# Patient Record
Sex: Male | Born: 1945
Health system: Southern US, Community
[De-identification: ages and names within clinical notes are randomized; demographics above are authoritative.]

## PROBLEM LIST (undated history)

## (undated) DIAGNOSIS — E785 Hyperlipidemia, unspecified: Secondary | ICD-10-CM

## (undated) DIAGNOSIS — E669 Obesity, unspecified: Secondary | ICD-10-CM

## (undated) DIAGNOSIS — G500 Trigeminal neuralgia: Secondary | ICD-10-CM

## (undated) DIAGNOSIS — I10 Essential (primary) hypertension: Secondary | ICD-10-CM

## (undated) DIAGNOSIS — M109 Gout, unspecified: Secondary | ICD-10-CM

## (undated) DIAGNOSIS — R42 Dizziness and giddiness: Secondary | ICD-10-CM

## (undated) DIAGNOSIS — Z800 Family history of malignant neoplasm of digestive organs: Secondary | ICD-10-CM

## (undated) DIAGNOSIS — E119 Type 2 diabetes mellitus without complications: Secondary | ICD-10-CM

## (undated) DIAGNOSIS — E039 Hypothyroidism, unspecified: Secondary | ICD-10-CM

## (undated) DIAGNOSIS — Z889 Allergy status to unspecified drugs, medicaments and biological substances status: Secondary | ICD-10-CM

## (undated) HISTORY — PX: COLONOSCOPY W/ POLYPECTOMY: SHX1380

## (undated) HISTORY — PX: INSERT / REPLACE / REMOVE PACEMAKER: SUR710

---

## 2005-07-18 ENCOUNTER — Ambulatory Visit: Payer: Self-pay | Admitting: Unknown Physician Specialty

## 2008-10-13 ENCOUNTER — Ambulatory Visit: Payer: Self-pay | Admitting: Unknown Physician Specialty

## 2010-08-27 ENCOUNTER — Emergency Department: Payer: Self-pay | Admitting: Emergency Medicine

## 2010-10-04 ENCOUNTER — Emergency Department: Payer: Self-pay | Admitting: Emergency Medicine

## 2013-10-24 ENCOUNTER — Ambulatory Visit: Payer: Self-pay | Admitting: Unknown Physician Specialty

## 2013-10-25 LAB — PATHOLOGY REPORT

## 2014-04-15 ENCOUNTER — Ambulatory Visit: Payer: Self-pay

## 2015-04-30 DIAGNOSIS — E039 Hypothyroidism, unspecified: Secondary | ICD-10-CM | POA: Diagnosis not present

## 2015-06-08 DIAGNOSIS — J208 Acute bronchitis due to other specified organisms: Secondary | ICD-10-CM | POA: Diagnosis not present

## 2015-06-08 DIAGNOSIS — B9689 Other specified bacterial agents as the cause of diseases classified elsewhere: Secondary | ICD-10-CM | POA: Diagnosis not present

## 2015-06-08 DIAGNOSIS — J019 Acute sinusitis, unspecified: Secondary | ICD-10-CM | POA: Diagnosis not present

## 2015-07-06 DIAGNOSIS — B9689 Other specified bacterial agents as the cause of diseases classified elsewhere: Secondary | ICD-10-CM | POA: Diagnosis not present

## 2015-07-06 DIAGNOSIS — J019 Acute sinusitis, unspecified: Secondary | ICD-10-CM | POA: Diagnosis not present

## 2015-07-06 DIAGNOSIS — R05 Cough: Secondary | ICD-10-CM | POA: Diagnosis not present

## 2015-07-24 DIAGNOSIS — J301 Allergic rhinitis due to pollen: Secondary | ICD-10-CM | POA: Diagnosis not present

## 2015-07-24 DIAGNOSIS — E038 Other specified hypothyroidism: Secondary | ICD-10-CM | POA: Diagnosis not present

## 2015-07-24 DIAGNOSIS — I159 Secondary hypertension, unspecified: Secondary | ICD-10-CM | POA: Diagnosis not present

## 2015-07-24 DIAGNOSIS — E063 Autoimmune thyroiditis: Secondary | ICD-10-CM | POA: Diagnosis not present

## 2015-07-24 DIAGNOSIS — G500 Trigeminal neuralgia: Secondary | ICD-10-CM | POA: Diagnosis not present

## 2015-07-24 DIAGNOSIS — M109 Gout, unspecified: Secondary | ICD-10-CM | POA: Diagnosis not present

## 2015-07-24 DIAGNOSIS — F5104 Psychophysiologic insomnia: Secondary | ICD-10-CM | POA: Diagnosis not present

## 2015-10-27 DIAGNOSIS — E039 Hypothyroidism, unspecified: Secondary | ICD-10-CM | POA: Diagnosis not present

## 2015-10-27 DIAGNOSIS — N400 Enlarged prostate without lower urinary tract symptoms: Secondary | ICD-10-CM | POA: Diagnosis not present

## 2015-10-27 DIAGNOSIS — I10 Essential (primary) hypertension: Secondary | ICD-10-CM | POA: Diagnosis not present

## 2015-10-27 DIAGNOSIS — E790 Hyperuricemia without signs of inflammatory arthritis and tophaceous disease: Secondary | ICD-10-CM | POA: Diagnosis not present

## 2015-10-27 DIAGNOSIS — Z0000 Encounter for general adult medical examination without abnormal findings: Secondary | ICD-10-CM | POA: Diagnosis not present

## 2015-10-27 DIAGNOSIS — E7800 Pure hypercholesterolemia, unspecified: Secondary | ICD-10-CM | POA: Diagnosis not present

## 2015-10-27 DIAGNOSIS — H8110 Benign paroxysmal vertigo, unspecified ear: Secondary | ICD-10-CM | POA: Diagnosis not present

## 2015-10-27 DIAGNOSIS — R739 Hyperglycemia, unspecified: Secondary | ICD-10-CM | POA: Diagnosis not present

## 2015-10-27 DIAGNOSIS — M722 Plantar fascial fibromatosis: Secondary | ICD-10-CM | POA: Diagnosis not present

## 2015-10-27 DIAGNOSIS — G500 Trigeminal neuralgia: Secondary | ICD-10-CM | POA: Diagnosis not present

## 2015-10-27 DIAGNOSIS — F5101 Primary insomnia: Secondary | ICD-10-CM | POA: Diagnosis not present

## 2015-10-27 DIAGNOSIS — F5104 Psychophysiologic insomnia: Secondary | ICD-10-CM | POA: Diagnosis not present

## 2015-10-27 DIAGNOSIS — Z6838 Body mass index (BMI) 38.0-38.9, adult: Secondary | ICD-10-CM | POA: Diagnosis not present

## 2015-10-27 DIAGNOSIS — Z125 Encounter for screening for malignant neoplasm of prostate: Secondary | ICD-10-CM | POA: Diagnosis not present

## 2015-12-09 DIAGNOSIS — Z08 Encounter for follow-up examination after completed treatment for malignant neoplasm: Secondary | ICD-10-CM | POA: Diagnosis not present

## 2015-12-09 DIAGNOSIS — Z1283 Encounter for screening for malignant neoplasm of skin: Secondary | ICD-10-CM | POA: Diagnosis not present

## 2015-12-09 DIAGNOSIS — D2372 Other benign neoplasm of skin of left lower limb, including hip: Secondary | ICD-10-CM | POA: Diagnosis not present

## 2015-12-09 DIAGNOSIS — Z85828 Personal history of other malignant neoplasm of skin: Secondary | ICD-10-CM | POA: Diagnosis not present

## 2015-12-09 DIAGNOSIS — L821 Other seborrheic keratosis: Secondary | ICD-10-CM | POA: Diagnosis not present

## 2015-12-09 DIAGNOSIS — L02821 Furuncle of head [any part, except face]: Secondary | ICD-10-CM | POA: Diagnosis not present

## 2016-01-28 DIAGNOSIS — Z6841 Body Mass Index (BMI) 40.0 and over, adult: Secondary | ICD-10-CM | POA: Diagnosis not present

## 2016-01-28 DIAGNOSIS — I10 Essential (primary) hypertension: Secondary | ICD-10-CM | POA: Diagnosis not present

## 2016-01-28 DIAGNOSIS — E039 Hypothyroidism, unspecified: Secondary | ICD-10-CM | POA: Diagnosis not present

## 2016-01-28 DIAGNOSIS — F5101 Primary insomnia: Secondary | ICD-10-CM | POA: Diagnosis not present

## 2016-01-28 DIAGNOSIS — E7800 Pure hypercholesterolemia, unspecified: Secondary | ICD-10-CM | POA: Diagnosis not present

## 2016-01-28 DIAGNOSIS — R0609 Other forms of dyspnea: Secondary | ICD-10-CM | POA: Diagnosis not present

## 2016-01-28 DIAGNOSIS — R4189 Other symptoms and signs involving cognitive functions and awareness: Secondary | ICD-10-CM | POA: Diagnosis not present

## 2016-01-28 DIAGNOSIS — G500 Trigeminal neuralgia: Secondary | ICD-10-CM | POA: Diagnosis not present

## 2016-01-28 DIAGNOSIS — R739 Hyperglycemia, unspecified: Secondary | ICD-10-CM | POA: Diagnosis not present

## 2016-01-28 DIAGNOSIS — N400 Enlarged prostate without lower urinary tract symptoms: Secondary | ICD-10-CM | POA: Diagnosis not present

## 2016-01-28 DIAGNOSIS — H8112 Benign paroxysmal vertigo, left ear: Secondary | ICD-10-CM | POA: Diagnosis not present

## 2016-01-28 DIAGNOSIS — E790 Hyperuricemia without signs of inflammatory arthritis and tophaceous disease: Secondary | ICD-10-CM | POA: Diagnosis not present

## 2016-02-09 DIAGNOSIS — J208 Acute bronchitis due to other specified organisms: Secondary | ICD-10-CM | POA: Diagnosis not present

## 2016-02-19 DIAGNOSIS — E038 Other specified hypothyroidism: Secondary | ICD-10-CM | POA: Diagnosis not present

## 2016-02-19 DIAGNOSIS — G500 Trigeminal neuralgia: Secondary | ICD-10-CM | POA: Diagnosis not present

## 2016-02-19 DIAGNOSIS — M109 Gout, unspecified: Secondary | ICD-10-CM | POA: Diagnosis not present

## 2016-02-19 DIAGNOSIS — E063 Autoimmune thyroiditis: Secondary | ICD-10-CM | POA: Diagnosis not present

## 2016-02-19 DIAGNOSIS — J301 Allergic rhinitis due to pollen: Secondary | ICD-10-CM | POA: Diagnosis not present

## 2016-02-19 DIAGNOSIS — F5104 Psychophysiologic insomnia: Secondary | ICD-10-CM | POA: Diagnosis not present

## 2016-03-29 DIAGNOSIS — B9689 Other specified bacterial agents as the cause of diseases classified elsewhere: Secondary | ICD-10-CM | POA: Diagnosis not present

## 2016-03-29 DIAGNOSIS — J019 Acute sinusitis, unspecified: Secondary | ICD-10-CM | POA: Diagnosis not present

## 2016-05-03 DIAGNOSIS — N400 Enlarged prostate without lower urinary tract symptoms: Secondary | ICD-10-CM | POA: Diagnosis not present

## 2016-05-03 DIAGNOSIS — R739 Hyperglycemia, unspecified: Secondary | ICD-10-CM | POA: Diagnosis not present

## 2016-05-03 DIAGNOSIS — I10 Essential (primary) hypertension: Secondary | ICD-10-CM | POA: Diagnosis not present

## 2016-05-03 DIAGNOSIS — F5101 Primary insomnia: Secondary | ICD-10-CM | POA: Diagnosis not present

## 2016-05-03 DIAGNOSIS — E039 Hypothyroidism, unspecified: Secondary | ICD-10-CM | POA: Diagnosis not present

## 2016-05-03 DIAGNOSIS — Z6841 Body Mass Index (BMI) 40.0 and over, adult: Secondary | ICD-10-CM | POA: Diagnosis not present

## 2016-05-03 DIAGNOSIS — G500 Trigeminal neuralgia: Secondary | ICD-10-CM | POA: Diagnosis not present

## 2016-08-09 DIAGNOSIS — E039 Hypothyroidism, unspecified: Secondary | ICD-10-CM | POA: Diagnosis not present

## 2016-08-09 DIAGNOSIS — I10 Essential (primary) hypertension: Secondary | ICD-10-CM | POA: Diagnosis not present

## 2016-08-09 DIAGNOSIS — E7800 Pure hypercholesterolemia, unspecified: Secondary | ICD-10-CM | POA: Diagnosis not present

## 2016-08-09 DIAGNOSIS — E790 Hyperuricemia without signs of inflammatory arthritis and tophaceous disease: Secondary | ICD-10-CM | POA: Diagnosis not present

## 2016-08-09 DIAGNOSIS — F5101 Primary insomnia: Secondary | ICD-10-CM | POA: Diagnosis not present

## 2016-08-09 DIAGNOSIS — R739 Hyperglycemia, unspecified: Secondary | ICD-10-CM | POA: Diagnosis not present

## 2016-08-09 DIAGNOSIS — N400 Enlarged prostate without lower urinary tract symptoms: Secondary | ICD-10-CM | POA: Diagnosis not present

## 2016-08-09 DIAGNOSIS — Z6841 Body Mass Index (BMI) 40.0 and over, adult: Secondary | ICD-10-CM | POA: Diagnosis not present

## 2016-08-26 DIAGNOSIS — G500 Trigeminal neuralgia: Secondary | ICD-10-CM | POA: Diagnosis not present

## 2016-10-31 DIAGNOSIS — J40 Bronchitis, not specified as acute or chronic: Secondary | ICD-10-CM | POA: Diagnosis not present

## 2016-10-31 DIAGNOSIS — R05 Cough: Secondary | ICD-10-CM | POA: Diagnosis not present

## 2016-11-04 ENCOUNTER — Inpatient Hospital Stay
Admission: EM | Admit: 2016-11-04 | Discharge: 2016-11-06 | DRG: 190 | Disposition: A | Discharge status: Home / Self Care | Payer: PPO | Attending: Internal Medicine | Admitting: Internal Medicine

## 2016-11-04 ENCOUNTER — Emergency Department: Payer: PPO

## 2016-11-04 DIAGNOSIS — Z7951 Long term (current) use of inhaled steroids: Secondary | ICD-10-CM

## 2016-11-04 DIAGNOSIS — E785 Hyperlipidemia, unspecified: Secondary | ICD-10-CM | POA: Diagnosis not present

## 2016-11-04 DIAGNOSIS — Z880 Allergy status to penicillin: Secondary | ICD-10-CM | POA: Diagnosis not present

## 2016-11-04 DIAGNOSIS — J189 Pneumonia, unspecified organism: Secondary | ICD-10-CM | POA: Diagnosis not present

## 2016-11-04 DIAGNOSIS — Z79899 Other long term (current) drug therapy: Secondary | ICD-10-CM | POA: Diagnosis not present

## 2016-11-04 DIAGNOSIS — N400 Enlarged prostate without lower urinary tract symptoms: Secondary | ICD-10-CM | POA: Diagnosis not present

## 2016-11-04 DIAGNOSIS — J441 Chronic obstructive pulmonary disease with (acute) exacerbation: Principal | ICD-10-CM | POA: Diagnosis present

## 2016-11-04 DIAGNOSIS — R05 Cough: Secondary | ICD-10-CM | POA: Diagnosis not present

## 2016-11-04 DIAGNOSIS — Z6841 Body Mass Index (BMI) 40.0 and over, adult: Secondary | ICD-10-CM | POA: Diagnosis not present

## 2016-11-04 DIAGNOSIS — E860 Dehydration: Secondary | ICD-10-CM | POA: Diagnosis present

## 2016-11-04 DIAGNOSIS — Z9109 Other allergy status, other than to drugs and biological substances: Secondary | ICD-10-CM | POA: Diagnosis not present

## 2016-11-04 DIAGNOSIS — F0390 Unspecified dementia without behavioral disturbance: Secondary | ICD-10-CM | POA: Diagnosis present

## 2016-11-04 DIAGNOSIS — M109 Gout, unspecified: Secondary | ICD-10-CM | POA: Diagnosis not present

## 2016-11-04 DIAGNOSIS — E039 Hypothyroidism, unspecified: Secondary | ICD-10-CM | POA: Diagnosis present

## 2016-11-04 DIAGNOSIS — J440 Chronic obstructive pulmonary disease with acute lower respiratory infection: Secondary | ICD-10-CM | POA: Diagnosis present

## 2016-11-04 DIAGNOSIS — Z888 Allergy status to other drugs, medicaments and biological substances status: Secondary | ICD-10-CM

## 2016-11-04 DIAGNOSIS — Z8249 Family history of ischemic heart disease and other diseases of the circulatory system: Secondary | ICD-10-CM

## 2016-11-04 DIAGNOSIS — R0602 Shortness of breath: Secondary | ICD-10-CM | POA: Diagnosis not present

## 2016-11-04 DIAGNOSIS — I10 Essential (primary) hypertension: Secondary | ICD-10-CM | POA: Diagnosis present

## 2016-11-04 DIAGNOSIS — G500 Trigeminal neuralgia: Secondary | ICD-10-CM | POA: Diagnosis not present

## 2016-11-04 DIAGNOSIS — Z833 Family history of diabetes mellitus: Secondary | ICD-10-CM | POA: Diagnosis not present

## 2016-11-04 DIAGNOSIS — E871 Hypo-osmolality and hyponatremia: Secondary | ICD-10-CM | POA: Diagnosis not present

## 2016-11-04 DIAGNOSIS — J301 Allergic rhinitis due to pollen: Secondary | ICD-10-CM | POA: Diagnosis present

## 2016-11-04 DIAGNOSIS — Z91013 Allergy to seafood: Secondary | ICD-10-CM | POA: Diagnosis not present

## 2016-11-04 LAB — CBC WITH DIFFERENTIAL/PLATELET
BASOS ABS: 0 10*3/uL (ref 0–0.1)
Basophils Relative: 0 %
EOS ABS: 0.3 10*3/uL (ref 0–0.7)
EOS PCT: 3 %
HCT: 44.4 % (ref 40.0–52.0)
HEMOGLOBIN: 15.6 g/dL (ref 13.0–18.0)
LYMPHS ABS: 1.3 10*3/uL (ref 1.0–3.6)
LYMPHS PCT: 17 %
MCH: 32.4 pg (ref 26.0–34.0)
MCHC: 35 g/dL (ref 32.0–36.0)
MCV: 92.4 fL (ref 80.0–100.0)
Monocytes Absolute: 1 10*3/uL (ref 0.2–1.0)
Monocytes Relative: 12 %
NEUTROS PCT: 68 %
Neutro Abs: 5.2 10*3/uL (ref 1.4–6.5)
PLATELETS: 114 10*3/uL — AB (ref 150–440)
RBC: 4.81 MIL/uL (ref 4.40–5.90)
RDW: 13.2 % (ref 11.5–14.5)
WBC: 7.8 10*3/uL (ref 3.8–10.6)

## 2016-11-04 LAB — BASIC METABOLIC PANEL
ANION GAP: 9 (ref 5–15)
BUN: 18 mg/dL (ref 6–20)
CALCIUM: 9.3 mg/dL (ref 8.9–10.3)
CO2: 22 mmol/L (ref 22–32)
Chloride: 98 mmol/L — ABNORMAL LOW (ref 101–111)
Creatinine, Ser: 0.86 mg/dL (ref 0.61–1.24)
Glucose, Bld: 130 mg/dL — ABNORMAL HIGH (ref 65–99)
POTASSIUM: 4 mmol/L (ref 3.5–5.1)
Sodium: 129 mmol/L — ABNORMAL LOW (ref 135–145)

## 2016-11-04 LAB — BRAIN NATRIURETIC PEPTIDE: B NATRIURETIC PEPTIDE 5: 12 pg/mL (ref 0.0–100.0)

## 2016-11-04 MED ORDER — DEXTROSE 5 % IV SOLN
1.0000 g | Freq: Once | INTRAVENOUS | Status: AC
  Administered 2016-11-05: 1 g via INTRAVENOUS
  Filled 2016-11-04: qty 10

## 2016-11-04 MED ORDER — SODIUM CHLORIDE 0.9 % IV BOLUS (SEPSIS)
500.0000 mL | Freq: Once | INTRAVENOUS | Status: AC
  Administered 2016-11-04: 500 mL via INTRAVENOUS

## 2016-11-04 MED ORDER — ALBUTEROL SULFATE (2.5 MG/3ML) 0.083% IN NEBU
5.0000 mg | INHALATION_SOLUTION | Freq: Once | RESPIRATORY_TRACT | Status: AC
  Administered 2016-11-04: 5 mg via RESPIRATORY_TRACT
  Filled 2016-11-04: qty 6

## 2016-11-04 MED ORDER — DEXTROSE 5 % IV SOLN
500.0000 mg | Freq: Once | INTRAVENOUS | Status: AC
  Administered 2016-11-05: 500 mg via INTRAVENOUS
  Filled 2016-11-04: qty 500

## 2016-11-04 MED ORDER — METHYLPREDNISOLONE SODIUM SUCC 125 MG IJ SOLR
125.0000 mg | Freq: Once | INTRAMUSCULAR | Status: AC
  Administered 2016-11-04: 125 mg via INTRAVENOUS
  Filled 2016-11-04: qty 2

## 2016-11-04 NOTE — ED Notes (Signed)
Ambulated pt. Down hallway about 100 ft. Pt. Maintained sats above 96 %.

## 2016-11-04 NOTE — ED Provider Notes (Addendum)
[facility] Emergency Department Provider Note  ____________________________________________   I have reviewed the triage vital signs and the nursing notes.   HISTORY  Chief Complaint Shortness of Breath and Cough    HPI Brian Huffman. is a 71 y.o. male with a history of COPD, multiple other medical problem is presents today with cough for 2 weeks, he has productive cough, subjective fever and chills at home, he has been started on a Z-Pak but is unable to breathe at home when he is here for that reason. Denies chest pain.  Nothing makes this better, nothing makes it worse. He does not have any significant orthopnea.   No past medical history on file.  There are no active problems to display for this patient.   No past surgical history on file.  Prior to Admission medications   Medication Sig Start Date End Date Taking? Authorizing Provider  allopurinol (ZYLOPRIM) 100 MG tablet Take 1 tablet by mouth daily. 09/18/16  Yes [provider]  amLODipine (NORVASC) 5 MG tablet Take 5 mg by mouth daily.   Yes [provider]  atorvastatin (LIPITOR) 10 MG tablet Take 10 mg by mouth daily.   Yes [provider]  azelastine (ASTELIN) 0.1 % nasal spray Place 2 sprays into both nostrils 2 (two) times daily. Use in each nostril as directed   Yes [provider]  azithromycin (ZITHROMAX) 250 MG tablet Take 1 tablet by mouth daily. 10/31/16  Yes [provider]  benzonatate (TESSALON) 100 MG capsule Take 100 mg by mouth 3 (three) times daily as needed for cough.   Yes [provider]  candesartan (ATACAND) 8 MG tablet Take 1 tablet by mouth 2 (two) times daily. 10/27/16  Yes [provider]  carbamazepine (CARBATROL) 100 MG 12 hr capsule Take 1 capsule by mouth 4 (four) times daily.  10/26/16  Yes [provider]  carbamazepine (TEGRETOL) 100 MG chewable tablet Chew 100 mg by mouth as needed.    Yes [provider]  cetirizine (ZYRTEC) 10 MG tablet Take 10 mg by mouth daily.   Yes [provider]  fluticasone (FLONASE) 50 MCG/ACT nasal spray Place 2 sprays into both nostrils daily.   Yes [provider]  gabapentin (NEURONTIN) 100 MG capsule Take 100 mg by mouth 3 (three) times daily.   Yes [provider]  levothyroxine (SYNTHROID, LEVOTHROID) 175 MCG tablet Take 175 mcg by mouth daily before breakfast.   Yes [provider]  memantine (NAMENDA) 5 MG tablet Take 5 mg by mouth 2 (two) times daily.   Yes [provider]  promethazine (PHENERGAN) 25 MG tablet Take 25 mg by mouth every 6 (six) hours as needed for nausea or vomiting.   Yes [provider]  rosuvastatin (CRESTOR) 5 MG tablet Take 5 mg by mouth as needed.    Yes [provider]  tamsulosin (FLOMAX) 0.4 MG CAPS capsule Take 0.4 mg by mouth daily.   Yes [provider]  traZODone (DESYREL) 50 MG tablet Take 50 mg by mouth at bedtime.   Yes [provider]    Allergies [place] grass allergy skin test; Other; Statins; Penicillins; and Shellfish-derived products  No family history on file.  Social History Social History  Substance Use Topics  . Smoking status: Not on file  . Smokeless tobacco: Not on file  . Alcohol use Not on file    Review of Systems Constitutional: positivefever/chills Eyes: No visual changes. ENT: No  sore throat. No stiff neck no neck pain Cardiovascular: Denies chest pain. Respiratory: positiveshortness of breath. Gastrointestinal:   no vomiting.  No diarrhea.  No constipation. Genitourinary: Negative for dysuria. Musculoskeletal: Negative lower extremity swelling Skin: Negative for rash. Neurological: Negative for severe headaches, focal weakness or numbness.   ____________________________________________   PHYSICAL EXAM:  VITAL SIGNS: ED Triage Vitals  Enc Vitals Group     BP 11/04/16 2039 (!)  184/76     Pulse Rate 11/04/16 2039 (!) 106     Resp 11/04/16 2039 (!) 32     Temp 11/04/16 2039 98.5 F (36.9 C)     Temp Source 11/04/16 2039 Oral     SpO2 11/04/16 2039 94 %     Weight 11/04/16 2040 240 lb (108.9 kg)     Height 11/04/16 2040 5' 4.5" (1.638 m)     Head Circumference --      Peak Flow --      Pain Score 11/04/16 2038 3     Pain Loc --      Pain Edu? --      Excl. in [place]? --     Constitutional: Alert and oriented. Initially, tachypnea, working to breathe. Eyes: Conjunctivae are normal Head: Atraumatic HEENT: No congestion/rhinnorhea. Mucous membranes are moist.  Oropharynx non-erythematous Neck:   Nontender with no meningismus, no masses, no stridor Cardiovascular: Normal rate, regular rhythm. Grossly normal heart sounds.  Good peripheral circulation. Respiratory: wrist respiratory effort with initial wheezes and occasional rhonchi noted. Abdominal: Soft and nontender. No distention. No guarding no rebound Back:  There is no focal tenderness or step off.  there is no midline tenderness there are no lesions noted. there is no CVA tenderness  Musculoskeletal: No lower extremity tenderness, no upper extremity tenderness. No joint effusions, no DVT signs strong distal pulses slight symmetric edema Neurologic:  Normal speech and language. No gross focal neurologic deficits are appreciated.  Skin:  Skin is warm, dry and intact. No rash noted. Psychiatric: Mood and affect are normal. Speech and behavior are normal.  ____________________________________________   LABS (all labs ordered are listed, but only abnormal results are displayed)  Labs Reviewed  CBC WITH DIFFERENTIAL/PLATELET - Abnormal; Notable for the following:       Result Value   Platelets 114 (*)    All other components within normal limits  BASIC METABOLIC PANEL - Abnormal; Notable for the following:    Sodium 129 (*)    Chloride 98 (*)    Glucose, Bld 130 (*)    All other components within normal  limits  CULTURE, BLOOD (ROUTINE X 2)  CULTURE, BLOOD (ROUTINE X 2)  BRAIN NATRIURETIC PEPTIDE   ____________________________________________  EKG  I personally interpreted any EKGs ordered by me or triage Sinus rhythm rate 94 bpm, left bundle-branch block. ____________________________________________  RADIOLOGY  I reviewed any imaging ordered by me or triage that were performed during my shift and, if possible, patient and/or family made aware of any abnormal findings. ____________________________________________   PROCEDURES  Procedure(s) performed: None  Procedures  Critical Care performed: None  ____________________________________________   INITIAL IMPRESSION / ASSESSMENT AND PLAN / ED COURSE  Pertinent labs & imaging results that were available during my care of the patient were reviewed by me and considered in my medical decision making (see chart for details).  Administration was COPD, fever and productive cough. Failed outpatient antibiotics. X-ray shows possible infiltrate. We are giving him prednisone and antibiotics and breathing treatments. His lungs were  much better after initial treatment, sats are reassuring respiratory rate is improving, patient is overall appearing better but as he has failed outpatient therapy I think he would benefit from admission hospitalist agrees and will admit   ----------------------------------------- 12:43 AM on 11/05/2016 -----------------------------------------  Troponin order did not process. I have ordered as an add on as a precaution. hospitalist and oncoming ED physician to follow up.     ____________________________________________   FINAL CLINICAL IMPRESSION(S) / ED DIAGNOSES  Final diagnoses:  COPD exacerbation ([place])      This chart was dictated using voice recognition software.  Despite best efforts to proofread,  errors can occur which can change meaning.      Schuyler Amor, MD 11/05/16 8833     Schuyler Amor, MD 11/05/16 [phone]

## 2016-11-04 NOTE — ED Triage Notes (Signed)
Pt to triage ambulatory with noted raspy sounding cough and shortness of breath. Pt reports seen by his PCP on Tuesday and started on a z pack and tessalon pearls. No improvement. Pt with tachypnea  during triage with diminished breath sounds through out.

## 2016-11-04 NOTE — ED Notes (Signed)
Pt. States having cough and not feeling well for the past 2 weeks.  Pt. States he saw his PCP this Tuesday and was started on Z-pack and tessalon pearls with no improvement.  Pt. States increase in shortness of breath today.

## 2016-11-05 DIAGNOSIS — F0390 Unspecified dementia without behavioral disturbance: Secondary | ICD-10-CM | POA: Diagnosis present

## 2016-11-05 DIAGNOSIS — Z888 Allergy status to other drugs, medicaments and biological substances status: Secondary | ICD-10-CM | POA: Diagnosis not present

## 2016-11-05 DIAGNOSIS — E039 Hypothyroidism, unspecified: Secondary | ICD-10-CM | POA: Diagnosis present

## 2016-11-05 DIAGNOSIS — J301 Allergic rhinitis due to pollen: Secondary | ICD-10-CM | POA: Diagnosis present

## 2016-11-05 DIAGNOSIS — G500 Trigeminal neuralgia: Secondary | ICD-10-CM | POA: Diagnosis present

## 2016-11-05 DIAGNOSIS — N400 Enlarged prostate without lower urinary tract symptoms: Secondary | ICD-10-CM | POA: Diagnosis present

## 2016-11-05 DIAGNOSIS — Z79899 Other long term (current) drug therapy: Secondary | ICD-10-CM | POA: Diagnosis not present

## 2016-11-05 DIAGNOSIS — J440 Chronic obstructive pulmonary disease with acute lower respiratory infection: Secondary | ICD-10-CM | POA: Diagnosis present

## 2016-11-05 DIAGNOSIS — Z9109 Other allergy status, other than to drugs and biological substances: Secondary | ICD-10-CM | POA: Diagnosis not present

## 2016-11-05 DIAGNOSIS — Z7951 Long term (current) use of inhaled steroids: Secondary | ICD-10-CM | POA: Diagnosis not present

## 2016-11-05 DIAGNOSIS — I10 Essential (primary) hypertension: Secondary | ICD-10-CM | POA: Diagnosis present

## 2016-11-05 DIAGNOSIS — Z6841 Body Mass Index (BMI) 40.0 and over, adult: Secondary | ICD-10-CM | POA: Diagnosis not present

## 2016-11-05 DIAGNOSIS — Z880 Allergy status to penicillin: Secondary | ICD-10-CM | POA: Diagnosis not present

## 2016-11-05 DIAGNOSIS — E871 Hypo-osmolality and hyponatremia: Secondary | ICD-10-CM | POA: Diagnosis present

## 2016-11-05 DIAGNOSIS — M109 Gout, unspecified: Secondary | ICD-10-CM | POA: Diagnosis present

## 2016-11-05 DIAGNOSIS — Z91013 Allergy to seafood: Secondary | ICD-10-CM | POA: Diagnosis not present

## 2016-11-05 DIAGNOSIS — E860 Dehydration: Secondary | ICD-10-CM | POA: Diagnosis present

## 2016-11-05 DIAGNOSIS — Z833 Family history of diabetes mellitus: Secondary | ICD-10-CM | POA: Diagnosis not present

## 2016-11-05 DIAGNOSIS — R0602 Shortness of breath: Secondary | ICD-10-CM | POA: Diagnosis present

## 2016-11-05 DIAGNOSIS — Z8249 Family history of ischemic heart disease and other diseases of the circulatory system: Secondary | ICD-10-CM | POA: Diagnosis not present

## 2016-11-05 DIAGNOSIS — E785 Hyperlipidemia, unspecified: Secondary | ICD-10-CM | POA: Diagnosis present

## 2016-11-05 DIAGNOSIS — J189 Pneumonia, unspecified organism: Secondary | ICD-10-CM | POA: Diagnosis present

## 2016-11-05 DIAGNOSIS — J441 Chronic obstructive pulmonary disease with (acute) exacerbation: Secondary | ICD-10-CM | POA: Diagnosis present

## 2016-11-05 LAB — CBC
HEMATOCRIT: 42.7 % (ref 40.0–52.0)
Hemoglobin: 15.3 g/dL (ref 13.0–18.0)
MCH: 33.1 pg (ref 26.0–34.0)
MCHC: 35.8 g/dL (ref 32.0–36.0)
MCV: 92.6 fL (ref 80.0–100.0)
Platelets: 140 10*3/uL — ABNORMAL LOW (ref 150–440)
RBC: 4.62 MIL/uL (ref 4.40–5.90)
RDW: 13.2 % (ref 11.5–14.5)
WBC: 9.8 10*3/uL (ref 3.8–10.6)

## 2016-11-05 LAB — BASIC METABOLIC PANEL
Anion gap: 11 (ref 5–15)
BUN: 14 mg/dL (ref 6–20)
CHLORIDE: 98 mmol/L — AB (ref 101–111)
CO2: 21 mmol/L — ABNORMAL LOW (ref 22–32)
CREATININE: 1.15 mg/dL (ref 0.61–1.24)
Calcium: 9 mg/dL (ref 8.9–10.3)
GFR calc Af Amer: >60 mL/min (ref >60)
GFR calc non Af Amer: >60 mL/min (ref >60)
GLUCOSE: 155 mg/dL — AB (ref 65–99)
POTASSIUM: 4.4 mmol/L (ref 3.5–5.1)
Sodium: 130 mmol/L — ABNORMAL LOW (ref 135–145)

## 2016-11-05 LAB — TROPONIN I: Troponin I: <0.03 ng/mL (ref <0.03)

## 2016-11-05 MED ORDER — ALLOPURINOL 100 MG PO TABS
100.0000 mg | ORAL_TABLET | Freq: Every day | ORAL | Status: DC
  Administered 2016-11-05 – 2016-11-06 (×2): 100 mg via ORAL
  Filled 2016-11-05 (×2): qty 1

## 2016-11-05 MED ORDER — PROMETHAZINE HCL 25 MG PO TABS
25.0000 mg | ORAL_TABLET | Freq: Four times a day (QID) | ORAL | Status: DC | PRN
  Filled 2016-11-05: qty 1

## 2016-11-05 MED ORDER — BISACODYL 5 MG PO TBEC: 5.0000 mg | DELAYED_RELEASE_TABLET | Freq: Every day | ORAL | Status: DC | PRN

## 2016-11-05 MED ORDER — ATORVASTATIN CALCIUM 20 MG PO TABS
10.0000 mg | ORAL_TABLET | Freq: Every day | ORAL | Status: DC
  Administered 2016-11-05: 10 mg via ORAL
  Filled 2016-11-05: qty 1

## 2016-11-05 MED ORDER — ACETAMINOPHEN 325 MG PO TABS
650.0000 mg | ORAL_TABLET | Freq: Four times a day (QID) | ORAL | Status: DC | PRN
  Administered 2016-11-05 (×2): 650 mg via ORAL
  Filled 2016-11-05 (×2): qty 2

## 2016-11-05 MED ORDER — ENOXAPARIN SODIUM 40 MG/0.4ML ~~LOC~~ SOLN
40.0000 mg | Freq: Two times a day (BID) | SUBCUTANEOUS | Status: DC
  Administered 2016-11-05 (×2): 40 mg via SUBCUTANEOUS
  Filled 2016-11-05 (×2): qty 0.4

## 2016-11-05 MED ORDER — IRBESARTAN 150 MG PO TABS
75.0000 mg | ORAL_TABLET | Freq: Every day | ORAL | Status: DC
  Administered 2016-11-05 – 2016-11-06 (×2): 75 mg via ORAL
  Filled 2016-11-05 (×2): qty 1

## 2016-11-05 MED ORDER — DEXTROSE 5 % IV SOLN
1.0000 g | INTRAVENOUS | Status: DC
  Administered 2016-11-06: 10:00:00 1 g via INTRAVENOUS
  Filled 2016-11-05: qty 10

## 2016-11-05 MED ORDER — CARBAMAZEPINE ER 100 MG PO TB12
100.0000 mg | ORAL_TABLET | Freq: Four times a day (QID) | ORAL | Status: DC
  Filled 2016-11-05 (×2): qty 1

## 2016-11-05 MED ORDER — IPRATROPIUM BROMIDE 0.02 % IN SOLN: 0.5000 mg | Freq: Four times a day (QID) | RESPIRATORY_TRACT | Status: DC | PRN

## 2016-11-05 MED ORDER — BENZONATATE 100 MG PO CAPS
100.0000 mg | ORAL_CAPSULE | Freq: Three times a day (TID) | ORAL | Status: DC | PRN
  Administered 2016-11-05: 15:00:00 100 mg via ORAL
  Filled 2016-11-05: qty 1

## 2016-11-05 MED ORDER — OXYCODONE HCL 5 MG PO TABS
5.0000 mg | ORAL_TABLET | ORAL | Status: DC | PRN
  Administered 2016-11-05: 03:00:00 5 mg via ORAL
  Filled 2016-11-05 (×2): qty 1

## 2016-11-05 MED ORDER — ACETAMINOPHEN 650 MG RE SUPP: 650.0000 mg | Freq: Four times a day (QID) | RECTAL | Status: DC | PRN

## 2016-11-05 MED ORDER — TRAZODONE HCL 50 MG PO TABS
50.0000 mg | ORAL_TABLET | Freq: Every day | ORAL | Status: DC
  Administered 2016-11-05: 50 mg via ORAL
  Filled 2016-11-05: qty 1

## 2016-11-05 MED ORDER — MAGNESIUM CITRATE PO SOLN
1.0000 | Freq: Once | ORAL | Status: DC | PRN
Start: 2016-11-05 — End: 2016-11-06
  Filled 2016-11-05: qty 296

## 2016-11-05 MED ORDER — SODIUM CHLORIDE 0.9 % IV SOLN
INTRAVENOUS | Status: DC
  Administered 2016-11-05 (×2): via INTRAVENOUS

## 2016-11-05 MED ORDER — ROSUVASTATIN CALCIUM 10 MG PO TABS: 5.0000 mg | ORAL_TABLET | Freq: Every day | ORAL | Status: DC

## 2016-11-05 MED ORDER — ONDANSETRON HCL 4 MG PO TABS: 4.0000 mg | ORAL_TABLET | Freq: Four times a day (QID) | ORAL | Status: DC | PRN

## 2016-11-05 MED ORDER — LEVOTHYROXINE SODIUM 50 MCG PO TABS
175.0000 ug | ORAL_TABLET | Freq: Every day | ORAL | Status: DC
  Administered 2016-11-05 – 2016-11-06 (×2): 175 ug via ORAL
  Filled 2016-11-05 (×3): qty 1

## 2016-11-05 MED ORDER — TAMSULOSIN HCL 0.4 MG PO CAPS
0.4000 mg | ORAL_CAPSULE | Freq: Every day | ORAL | Status: DC
  Administered 2016-11-05 – 2016-11-06 (×2): 0.4 mg via ORAL
  Filled 2016-11-05 (×2): qty 1

## 2016-11-05 MED ORDER — MEMANTINE HCL 5 MG PO TABS
5.0000 mg | ORAL_TABLET | Freq: Two times a day (BID) | ORAL | Status: DC
  Administered 2016-11-05 – 2016-11-06 (×3): 5 mg via ORAL
  Filled 2016-11-05 (×3): qty 1

## 2016-11-05 MED ORDER — DEXTROSE 5 % IV SOLN
500.0000 mg | INTRAVENOUS | Status: DC
  Administered 2016-11-06: 11:00:00 500 mg via INTRAVENOUS
  Filled 2016-11-05: qty 500

## 2016-11-05 MED ORDER — METHYLPREDNISOLONE SODIUM SUCC 125 MG IJ SOLR
60.0000 mg | Freq: Four times a day (QID) | INTRAMUSCULAR | Status: DC
  Administered 2016-11-05 – 2016-11-06 (×6): 60 mg via INTRAVENOUS
  Filled 2016-11-05 (×6): qty 2

## 2016-11-05 MED ORDER — ALBUTEROL SULFATE (2.5 MG/3ML) 0.083% IN NEBU: 2.5000 mg | INHALATION_SOLUTION | Freq: Four times a day (QID) | RESPIRATORY_TRACT | Status: DC | PRN

## 2016-11-05 MED ORDER — LORATADINE 10 MG PO TABS
10.0000 mg | ORAL_TABLET | Freq: Every day | ORAL | Status: DC
  Administered 2016-11-05 – 2016-11-06 (×2): 10 mg via ORAL
  Filled 2016-11-05 (×2): qty 1

## 2016-11-05 MED ORDER — CARBAMAZEPINE ER 100 MG PO TB12
100.0000 mg | ORAL_TABLET | Freq: Four times a day (QID) | ORAL | Status: DC
  Administered 2016-11-05 – 2016-11-06 (×6): 100 mg via ORAL
  Filled 2016-11-05 (×11): qty 1

## 2016-11-05 MED ORDER — GABAPENTIN 100 MG PO CAPS
100.0000 mg | ORAL_CAPSULE | Freq: Three times a day (TID) | ORAL | Status: DC
  Administered 2016-11-05 – 2016-11-06 (×4): 100 mg via ORAL
  Filled 2016-11-05 (×4): qty 1

## 2016-11-05 MED ORDER — FLUTICASONE PROPIONATE 50 MCG/ACT NA SUSP
2.0000 | Freq: Every day | NASAL | Status: DC
  Administered 2016-11-05 – 2016-11-06 (×2): 2 via NASAL
  Filled 2016-11-05: qty 16

## 2016-11-05 MED ORDER — AMLODIPINE BESYLATE 5 MG PO TABS
5.0000 mg | ORAL_TABLET | Freq: Every day | ORAL | Status: DC
  Administered 2016-11-05 – 2016-11-06 (×2): 5 mg via ORAL
  Filled 2016-11-05 (×2): qty 1

## 2016-11-05 MED ORDER — ONDANSETRON HCL 4 MG/2ML IJ SOLN: 4.0000 mg | Freq: Four times a day (QID) | INTRAMUSCULAR | Status: DC | PRN

## 2016-11-05 MED ORDER — SENNOSIDES-DOCUSATE SODIUM 8.6-50 MG PO TABS: 1.0000 | ORAL_TABLET | Freq: Every evening | ORAL | Status: DC | PRN

## 2016-11-05 NOTE — Progress Notes (Signed)
Adm,itted  for community-acquired pneumonia, failed outpatient therapy. Continue IV Rocephin, Zithromax, IV hydration, nebulizers. Patient likely will be going home tomorrow. Vitals stable, sodium improved. He says he feels better. No hypoxia, vitals stable. Continue IV fluids today. Time spent; 25 min

## 2016-11-05 NOTE — ED Notes (Signed)
Pt transport to 124 

## 2016-11-05 NOTE — Progress Notes (Signed)
Pharmacy Antibiotic Note  Brian Huffman. is a 71 y.o. male admitted on 11/04/2016 with pneumonia.  Pharmacy has been consulted for ceftriaxone dosing.  Plan: Will start ceftriaxone 1g IV daily  Height: 5\' 4"  (162.6 cm) Weight: 243 lb 3.2 oz (110.3 kg) IBW/kg (Calculated) : 59.2  Temp (24hrs), Avg:99 F (37.2 C), Min:98.5 F (36.9 C), Max:99.5 F (37.5 C)   Recent Labs Lab 11/04/16 2105  WBC 7.8  CREATININE 0.86    Estimated Creatinine Clearance: 87.4 mL/min (by C-G formula based on SCr of 0.86 mg/dL).    Allergies  Allergen Reactions  . [place] Grass Allergy Skin Test Other (See Comments)    Difficulty breathing  . Other Other (See Comments)    DUST, sneezing, watery eyes  . Statins Other (See Comments)    Atorvastatin, simvastatin and pravastatin    . Penicillins Rash  . Shellfish-Derived Products Rash     Thank you for allowing pharmacy to be a part of this patient's care.  Tobie Lords, PharmD, BCPS Clinical Pharmacist 11/05/2016

## 2016-11-05 NOTE — Progress Notes (Signed)
Physical Therapy Evaluation Patient Details Name: Brian Huffman. MRN: [id number] DOB: [birth year] Today's Date: 11/05/2016   History of Present Illness  Patient is a 71 y.o. male admitted on 22 JUL for increasing cough x2 weeks, not responding to antibiotic. PMH includes BPH, HTN, HLD, and hyperglycemia.  Clinical Impression  Patient is a pleasant male admitted for above listed reasons. Patient demonstrates independence in all aspects of mobility with oxygen saturations remaining >95% with 340' ambulation. Patient is deemed to be at baseline level of function and will not require PT upon d/c when medically ready.    Follow Up Recommendations No PT follow up    Equipment Recommendations  None recommended by PT    Recommendations for Other Services       Precautions / Restrictions Precautions Precautions: None Restrictions Weight Bearing Restrictions: No      Mobility  Bed Mobility Overal bed mobility: Independent             General bed mobility comments: Patient seated at EOB upon PT arrival.  Transfers Overall transfer level: Independent Equipment used: None             General transfer comment: Patient moves from sit to stand and stand to sit with good control.  Ambulation/Gait Ambulation/Gait assistance: Min guard Ambulation Distance (Feet): 340 Feet Assistive device: None       General Gait Details: Patient ambulates with non-antalgic gait, at normal cadence.  Stairs            Wheelchair Mobility    Modified Rankin (Stroke Patients Only)       Balance Overall balance assessment: Independent                                           Pertinent Vitals/Pain Pain Assessment: No/denies pain    Home Living Family/patient expects to be discharged to:: Private residence Living Arrangements: Spouse/significant other Available Help at Discharge: Family;Friend(s);Available PRN/intermittently Type of Home:  House Home Access: Stairs to enter Entrance Stairs-Rails: Can reach both Entrance Stairs-Number of Steps: 3 Home Layout: One level Home Equipment: Cane - single point      Prior Function Level of Independence: Independent               Hand Dominance        Extremity/Trunk Assessment   Upper Extremity Assessment Upper Extremity Assessment: Overall WFL for tasks assessed    Lower Extremity Assessment Lower Extremity Assessment: Overall WFL for tasks assessed       Communication   Communication: No difficulties  Cognition Arousal/Alertness: Awake/alert Behavior During Therapy: WFL for tasks assessed/performed Overall Cognitive Status: Within Functional Limits for tasks assessed                                        General Comments      Exercises     Assessment/Plan    PT Assessment Patent does not need any further PT services  PT Problem List         PT Treatment Interventions      PT Goals (Current goals can be found in the Care Plan section)  Acute Rehab PT Goals Patient Stated Goal: To feel better PT Goal Formulation: With patient Time For Goal Achievement: 11/19/16 Potential to Achieve Goals:  Good    Frequency     Barriers to discharge        Co-evaluation               AM-PAC PT "6 Clicks" Daily Activity  Outcome Measure Difficulty turning over in bed (including adjusting bedclothes, sheets and blankets)?: None Difficulty moving from lying on back to sitting on the side of the bed? : None Difficulty sitting down on and standing up from a chair with arms (e.g., wheelchair, bedside commode, etc,.)?: None Help needed moving to and from a bed to chair (including a wheelchair)?: None Help needed walking in hospital room?: None Help needed climbing 3-5 steps with a railing? : A Little 6 Click Score: 23    End of Session Equipment Utilized During Treatment: Gait belt Activity Tolerance: Patient tolerated treatment  well Patient left: in bed;with call bell/phone within reach;with family/visitor present   PT Visit Diagnosis: Muscle weakness (generalized) (M62.81)    Time: 4128-2081 PT Time Calculation (min) (ACUTE ONLY): 14 min   Charges:   PT Evaluation $PT Eval Low Complexity: 1 Procedure     PT G Codes:          Dorice Lamas, PT, DPT 11/05/2016, 2:43 PM

## 2016-11-05 NOTE — H&P (Addendum)
History and Physical   [facility] - [place] @ [facility] Admission History and Physical [employer], D.O.    Patient Name: Brian Huffman MR#: [id number] Date of Birth: 04/15/1946 Date of Admission: 11/04/2016  Referring MD/NP/PA: Dr. Burlene Arnt Primary Care Physician: System, Pcp Not In Patient coming from: Home  Chief Complaint:  Chief Complaint  Patient presents with  . Shortness of Breath  . Cough    HPI: Brian Huffman is a 71 y.o. male with a known history of BPH, hypertension, hyperlipidemia, hypothyroidism, hyperglycemia, morbid obesity presents to the emergency department for evaluation of shortness of breath and cough.  Patient was in a usual state of health until 2 weeks ago he developed a cough productive of clear to white sputum associated with fevers and chills. He has been taking a azithromycin however symptoms have been progressively worsening despite outpatient therapy.  Patient denies fevers/chills, weakness, dizziness, chest pain, N/V/C/D, abdominal pain, dysuria/frequency, changes in mental status.    Otherwise there has been no change in status. Patient has been taking medication as prescribed and there has been no recent change in medication or diet. There has been no recent illness, hospitalizations, travel or sick contacts.    EMS/ED Course: Patient received Rocephin, erythromycin, Solu-Medrol, nebulizer and normal saline. Medical admission was requested for further management of community-acquired pneumonia.  Review of Systems:  CONSTITUTIONAL: Positive fever/chills, fatigue, negative weakness, weight gain/loss, headache. EYES: No blurry or double vision. ENT: No tinnitus, postnasal drip, redness or soreness of the oropharynx. RESPIRATORY: Positive cough, dyspnea, wheeze.  No hemoptysis.  CARDIOVASCULAR: No chest pain, palpitations, syncope, orthopnea. No lower extremity edema.  GASTROINTESTINAL: No nausea, vomiting, abdominal pain, diarrhea, constipation.   No hematemesis, melena or hematochezia. GENITOURINARY: No dysuria, frequency, hematuria. ENDOCRINE: No polyuria or nocturia. No heat or cold intolerance. HEMATOLOGY: No anemia, bruising, bleeding. INTEGUMENTARY: No rashes, ulcers, lesions. MUSCULOSKELETAL: No arthritis, gout, dyspnea. NEUROLOGIC: No numbness, tingling, ataxia, seizure-type activity, weakness. PSYCHIATRIC: No anxiety, depression, insomnia.   Active Problems Reconcile with Patient's Chart  Problem Noted Date  Idiopathic trigeminal neuralgia 08/30/2016  Obesity, morbid, BMI 40.0-49.9 (CMS-HCC) 08/30/2016  Hyperglycemia, unspecified 01/28/2016  Hyperuricemia 10/27/2015  Benign prostatic hyperplasia without lower urinary tract symptoms 10/27/2015  Primary insomnia 10/27/2015  BPPV (benign paroxysmal positional vertigo) 10/27/2015  BMI 40.0-44.9, adult (CMS-HCC) 10/27/2015  Secondary trigeminal neuralgia 03/28/2015  Allergic rhinitis due to pollen 09/12/2014  Hypertension 02/11/2013  Hypothyroidism, unspecified 08/02/2012  Hyperlipidemia, unspecified 08/02/2012  Gout 08/02/2012  Trigeminal neuralgia      No past surgical history on file.   has no tobacco, alcohol, and drug history on file.  Allergies  Allergen Reactions  . [place] Grass Allergy Skin Test Other (See Comments)    Difficulty breathing  . Other Other (See Comments)    DUST, sneezing, watery eyes  . Statins Other (See Comments)    Atorvastatin, simvastatin and pravastatin    . Penicillins Rash  . Shellfish-Derived Products Rash    Family History   Medical History Relation Name Comments  Colon cancer Father    Diabetes type II Father    Stroke Mother    Colon cancer Sister    High blood pressure (Hypertension) Sister    Hyperlipidemia (Elevated cholesterol) Sister    Diabetes type II Son       Prior to Admission medications   Medication Sig Start Date End Date Taking? Authorizing Provider  allopurinol (ZYLOPRIM) 100 MG  tablet Take 1 tablet by mouth daily. 09/18/16  Yes [provider]  amLODipine (NORVASC) 5 MG tablet Take 5 mg by mouth daily.   Yes [provider]  atorvastatin (LIPITOR) 10 MG tablet Take 10 mg by mouth daily.   Yes [provider]  azelastine (ASTELIN) 0.1 % nasal spray Place 2 sprays into both nostrils 2 (two) times daily. Use in each nostril as directed   Yes [provider]  azithromycin (ZITHROMAX) 250 MG tablet Take 1 tablet by mouth daily. 10/31/16  Yes [provider]  benzonatate (TESSALON) 100 MG capsule Take 100 mg by mouth 3 (three) times daily as needed for cough.   Yes [provider]  candesartan (ATACAND) 8 MG tablet Take 1 tablet by mouth 2 (two) times daily. 10/27/16  Yes [provider]  carbamazepine (CARBATROL) 100 MG 12 hr capsule Take 1 capsule by mouth 4 (four) times daily.  10/26/16  Yes [provider]  carbamazepine (TEGRETOL) 100 MG chewable tablet Chew 100 mg by mouth as needed.   Yes [provider]  cetirizine (ZYRTEC) 10 MG tablet Take 10 mg by mouth daily.   Yes [provider]  fluticasone (FLONASE) 50 MCG/ACT nasal spray Place 2 sprays into both nostrils daily.   Yes [provider]  gabapentin (NEURONTIN) 100 MG capsule Take 100 mg by mouth 3 (three) times daily.   Yes [provider]  levothyroxine (SYNTHROID, LEVOTHROID) 175 MCG tablet Take 175 mcg by mouth daily before breakfast.   Yes [provider]  memantine (NAMENDA) 5 MG tablet Take 5 mg by mouth 2 (two) times daily.   Yes [provider]  promethazine (PHENERGAN) 25 MG tablet Take 25 mg by mouth every 6 (six) hours as needed for nausea or vomiting.   Yes [provider]  rosuvastatin (CRESTOR) 5 MG tablet Take 5 mg by mouth as needed.    Yes [provider]  tamsulosin (FLOMAX) 0.4 MG CAPS capsule Take 0.4 mg by mouth daily.   Yes [provider]    traZODone (DESYREL) 50 MG tablet Take 50 mg by mouth at bedtime.   Yes [provider]    Physical Exam: Vitals:   11/04/16 2238 11/04/16 2300 11/04/16 2345 11/05/16 0015  BP: 121/65     Pulse: 94 93 85 86  Resp:      Temp:      TempSrc:      SpO2: 98% 94% 96% 94%  Weight:      Height:        GENERAL: 71 y.o.-year-old male patient, well-developed, well-nourished lying in the bed in no acute distress.  Pleasant and cooperative.   HEENT: Head atraumatic, normocephalic. Pupils equal, round, reactive to light and accommodation. No scleral icterus. Extraocular muscles intact. Nares are patent. Oropharynx is clear. Mucus membranes moist. NECK: Supple, full range of motion. No JVD, no bruit heard. No thyroid enlargement, no tenderness, no cervical lymphadenopathy. CHEST: Bibasilar rhonchi.. No use of accessory muscles of respiration.  No reproducible chest wall tenderness.  CARDIOVASCULAR: S1, S2 normal. No murmurs, rubs, or gallops. Cap refill <2 seconds. Pulses intact distally.  ABDOMEN: Soft, nondistended, nontender. No rebound, guarding, rigidity. Normoactive bowel sounds present in all four quadrants. No organomegaly or mass. EXTREMITIES: No pedal edema, cyanosis, or clubbing. No calf tenderness or Homan's sign.  NEUROLOGIC: The patient is alert and oriented x 3. Cranial nerves II through XII are grossly intact with no focal sensorimotor deficit.  PSYCHIATRIC:  Normal affect, mood, thought content. SKIN: Warm, dry, and intact without  obvious rash, lesion, or ulcer.    Labs on Admission:  CBC:  Recent Labs Lab 11/04/16 2105  WBC 7.8  NEUTROABS 5.2  HGB 15.6  HCT 44.4  MCV 92.4  PLT 096*   Basic Metabolic Panel:  Recent Labs Lab 11/04/16 2105  NA 129*  K 4.0  CL 98*  CO2 22  GLUCOSE 130*  BUN 18  CREATININE 0.86  CALCIUM 9.3   GFR: Estimated Creatinine Clearance: 87.6 mL/min (by C-G formula based on SCr of 0.86 mg/dL). Liver Function Tests: No results  for input(s): AST, ALT, ALKPHOS, BILITOT, PROT, ALBUMIN in the last 168 hours. No results for input(s): LIPASE, AMYLASE in the last 168 hours. No results for input(s): AMMONIA in the last 168 hours. Coagulation Profile: No results for input(s): INR, PROTIME in the last 168 hours. Cardiac Enzymes: No results for input(s): CKTOTAL, CKMB, CKMBINDEX, TROPONINI in the last 168 hours. BNP (last 3 results) No results for input(s): PROBNP in the last 8760 hours. HbA1C: No results for input(s): HGBA1C in the last 72 hours. CBG: No results for input(s): GLUCAP in the last 168 hours. Lipid Profile: No results for input(s): CHOL, HDL, LDLCALC, TRIG, CHOLHDL, LDLDIRECT in the last 72 hours. Thyroid Function Tests: No results for input(s): TSH, T4TOTAL, FREET4, T3FREE, THYROIDAB in the last 72 hours. Anemia Panel: No results for input(s): VITAMINB12, FOLATE, FERRITIN, TIBC, IRON, RETICCTPCT in the last 72 hours. Urine analysis: No results found for: COLORURINE, APPEARANCEUR, LABSPEC, PHURINE, GLUCOSEU, HGBUR, BILIRUBINUR, KETONESUR, PROTEINUR, UROBILINOGEN, NITRITE, LEUKOCYTESUR Sepsis Labs: @LABRCNTIP (procalcitonin:4,lacticidven:4) )No results found for this or any previous visit (from the past 240 hour(s)).   Radiological Exams on Admission: Dg Chest 2 View  Result Date: 11/04/2016 CLINICAL DATA:  Cough and malaise for the past 2 weeks. EXAM: CHEST  2 VIEW COMPARISON:  04/15/2014 FINDINGS: The heart size and mediastinal contours are within normal limits. Patchy bibasilar opacities are noted suspicious for atelectasis and/or minimal pneumonia. No effusion or pneumothorax. No acute nor suspicious osseous abnormality. IMPRESSION: Subtle bibasilar airspace opacities suspicious for atelectasis and/or minimal pneumonia. Electronically Signed   By: Ashley Royalty M.D.   On: 11/04/2016 21:39    EKG: Normal sinus rhythm at 94 bpm with normal axis, left bundle branch block and nonspecific ST-T wave changes.     Assessment/Plan  This is a 71 y.o. male with a history of COPD, BPH, hypertension, hyperlipidemia, hypothyroidism, hyperglycemia, morbid obesity now being admitted with:  #. Community Acquired Pneumonia, failed outpatient therapy - Admit to inpatient - IV Rocephin & Azithromycin per pharmacy - IV fluid hydration - Duonebs, expectorants & O2 therapy as needed - Follow up blood & sputum cultures -Continue Zyrtec, Flonase  #. Hyponatremia - IVFs -Repeat BMP in AM  #. History of hypertension -Continue Norvasc, Arava Sartin  #. History of hyperlipidemia - Continue Lipitor  #. History of gout - Continue allopurinol  #. History of hypothyroidism - Continue Synthroid  #. History of dementia - Continue Namenda  Admission status: Inpatient IV Fluids: Normal saline Diet/Nutrition: Heart healthy Consults called: None  DVT Px: Lovenox, SCDs and early ambulation. Code Status: Full Code  Disposition Plan: To home in 1-2 days  All the records are reviewed and case discussed with ED provider. Management plans discussed with the patient and/or family who express understanding and agree with plan of care.  Vear Staton D.O. on 11/05/2016 at 12:41 AM Between 7am to 6pm - Pager - [phone] After 6pm go to [email or web address] - [place]  [facility] Office [phone] CC: Primary care physician; System, Pcp Not In   11/05/2016, 12:41 AM

## 2016-11-06 MED ORDER — AZITHROMYCIN 500 MG PO TABS: 500.0000 mg | ORAL_TABLET | Freq: Every day | ORAL | Status: DC

## 2016-11-06 MED ORDER — LEVOFLOXACIN 500 MG PO TABS: 500.0000 mg | ORAL_TABLET | Freq: Every day | ORAL | 0 refills | Status: AC

## 2016-11-06 MED ORDER — ALBUTEROL SULFATE HFA 108 (90 BASE) MCG/ACT IN AERS: 2.0000 | INHALATION_SPRAY | Freq: Four times a day (QID) | RESPIRATORY_TRACT | 2 refills | Status: AC | PRN

## 2016-11-06 MED ORDER — PREDNISONE 10 MG (21) PO TBPK: ORAL_TABLET | ORAL | 0 refills | Status: DC

## 2016-11-06 MED ORDER — HYDROCOD POLST-CPM POLST ER 10-8 MG/5ML PO SUER
5.0000 mL | Freq: Two times a day (BID) | ORAL | Status: DC | PRN
  Administered 2016-11-06: 5 mL via ORAL
  Filled 2016-11-06: qty 5

## 2016-11-06 NOTE — Discharge Summary (Signed)
Brian Huffman., is a 71 y.o. male  DOB [birth year]  MRN [id number].  Admission date:  11/04/2016  Admitting Physician  Harvie Bridge, DO  Discharge Date:  11/06/2016   Primary MD  System, Pcp Not In  Recommendations for primary care physician for things to follow:  PCP in [facility] in 1 week.   Admission Diagnosis  COPD exacerbation ([place]) [J44.1]   Discharge Diagnosis  COPD exacerbation ([facility] [place]) [J44.1]    Active Problems:   Community acquired pneumonia      No past medical history on file.  No past surgical history on file.     History of present illness and  Hospital Course:     Kindly see H&P for history of present illness and admission details, please review complete Labs, Consult reports and Test reports for all details in brief  HPI  from the history and physical done on the day of admission 71 year old male patient admitted because of worsening cough, shortness of breath, not responding to oral antibiotics. Patient tried azithromycin at home, symptoms of cough, shortness of breath worsened despite outpatient and further therapy so she is admitted For community-acquired pneumonia, failed outpatient therapy.  Hospital Course  #1 community-acquired pneumonia, failed outpatient therapy. Admitted to hospitalist service, started on Rocephin, Zithromax, continued on bronchodilators with DuoNeb's, steroids. Patient symptoms might nicely improved, discharged home today with Augmentin, prednisone dose pack, albuterol. Patient says that he has less cough and his feeling better and wants to go home. #2. hyponatremia due to dehydration: Improved with IV hydration, sodium improved from 129 to 130.     Discharge Condition: stable   Follow UP  Follow-up Information    PCP in [facility] Follow up in 1  week(s).             Discharge Instructions  and  Discharge Medications      Allergies as of 11/06/2016      Reactions   [place] Grass Allergy Skin Test Other (See Comments)   Difficulty breathing   Other Other (See Comments)   DUST, sneezing, watery eyes   Statins Other (See Comments)   Atorvastatin, simvastatin and pravastatin     Penicillins Rash   Shellfish-derived Products Rash      Medication List    TAKE these medications   albuterol 108 (90 Base) MCG/ACT inhaler Commonly known as:  PROVENTIL HFA;VENTOLIN HFA Inhale 2 puffs into the lungs every 6 (six) hours as needed for wheezing or shortness of breath.   allopurinol 100 MG tablet Commonly known as:  ZYLOPRIM Take 1 tablet by mouth daily.   amLODipine 5 MG tablet Commonly known as:  NORVASC Take 5 mg by mouth daily.   atorvastatin 10 MG tablet Commonly known as:  LIPITOR Take 10 mg by mouth daily.   azelastine 0.1 % nasal spray Commonly known as:  ASTELIN Place 2 sprays into both nostrils 2 (two) times daily. Use in each nostril as directed   azithromycin 250 MG tablet Commonly known as:  ZITHROMAX Take 1 tablet by mouth daily.   benzonatate 100 MG capsule Commonly known as:  TESSALON Take 100 mg by mouth 3 (three) times daily as needed for cough.   candesartan 8 MG tablet Commonly known as:  ATACAND Take 1 tablet by mouth 2 (two) times daily.   carbamazepine 100 MG chewable tablet Commonly known as:  TEGRETOL Chew 100 mg by mouth as needed.   carbamazepine 100 MG 12 hr capsule Commonly known as:  CARBATROL  Take 1 capsule by mouth 4 (four) times daily.   cetirizine 10 MG tablet Commonly known as:  ZYRTEC Take 10 mg by mouth daily.   fluticasone 50 MCG/ACT nasal spray Commonly known as:  FLONASE Place 2 sprays into both nostrils daily.   gabapentin 100 MG capsule Commonly known as:  NEURONTIN Take 100 mg by mouth 3 (three) times daily.   levofloxacin 500 MG tablet Commonly known  as:  LEVAQUIN Take 1 tablet (500 mg total) by mouth daily.   levothyroxine 175 MCG tablet Commonly known as:  SYNTHROID, LEVOTHROID Take 175 mcg by mouth daily before breakfast.   memantine 5 MG tablet Commonly known as:  NAMENDA Take 5 mg by mouth 2 (two) times daily.   predniSONE 10 MG (21) Tbpk tablet Commonly known as:  STERAPRED UNI-PAK 21 TAB Taper by 10 mg daily   promethazine 25 MG tablet Commonly known as:  PHENERGAN Take 25 mg by mouth every 6 (six) hours as needed for nausea or vomiting.   rosuvastatin 5 MG tablet Commonly known as:  CRESTOR Take 5 mg by mouth as needed.   tamsulosin 0.4 MG Caps capsule Commonly known as:  FLOMAX Take 0.4 mg by mouth daily.   traZODone 50 MG tablet Commonly known as:  DESYREL Take 50 mg by mouth at bedtime.         Diet and Activity recommendation: See Discharge Instructions above   Consults obtained - None   Major procedures and Radiology Reports - PLEASE review detailed and final reports for all details, in brief -      Dg Chest 2 View  Result Date: 11/04/2016 CLINICAL DATA:  Cough and malaise for the past 2 weeks. EXAM: CHEST  2 VIEW COMPARISON:  04/15/2014 FINDINGS: The heart size and mediastinal contours are within normal limits. Patchy bibasilar opacities are noted suspicious for atelectasis and/or minimal pneumonia. No effusion or pneumothorax. No acute nor suspicious osseous abnormality. IMPRESSION: Subtle bibasilar airspace opacities suspicious for atelectasis and/or minimal pneumonia. Electronically Signed   By: Ashley Royalty M.D.   On: 11/04/2016 21:39    Micro Results     No results found for this or any previous visit (from the past 240 hour(s)).     Today   Subjective:   Ugo Thoma today has no headache,no chest abdominal pain,no new weakness tingling or numbness, feels much better wants to go home today.   Objective:   Blood pressure 133/69, pulse 68, temperature 97.7 F (36.5 C),  temperature source Oral, resp. rate 14, height 5\' 4"  (1.626 m), weight 110.3 kg (243 lb 3.2 oz), SpO2 94 %.   Intake/Output Summary (Last 24 hours) at 11/06/16 0912 Last data filed at 11/06/16 0659  Gross per 24 hour  Intake          2513.75 ml  Output                0 ml  Net          2513.75 ml    Exam Awake Alert, Oriented x 3, No new F.N deficits, Normal affect [place].AT,PERRAL Supple Neck,No JVD, No cervical lymphadenopathy appriciated.  Symmetrical Chest wall movement, Good air movement bilaterally, CTAB RRR,No Gallops,Rubs or new Murmurs, No Parasternal Heave +ve B.Sounds, Abd Soft, Non tender, No organomegaly appriciated, No rebound -guarding or rigidity. No Cyanosis, Clubbing or edema, No new Rash or bruise  Data Review   CBC w Diff: Lab Results  Component Value Date   WBC 9.8 11/05/2016  HGB 15.3 11/05/2016   HCT 42.7 11/05/2016   PLT 140 (L) 11/05/2016   LYMPHOPCT 17 11/04/2016   MONOPCT 12 11/04/2016   EOSPCT 3 11/04/2016   BASOPCT 0 11/04/2016    CMP: Lab Results  Component Value Date   NA 130 (L) 11/05/2016   K 4.4 11/05/2016   CL 98 (L) 11/05/2016   CO2 21 (L) 11/05/2016   BUN 14 11/05/2016   CREATININE 1.15 11/05/2016  .   Total Time in preparing paper work, data evaluation and todays exam - 61 minutes  Tyvion Edmondson M.D on 11/06/2016 at 9:12 AM    Note: This dictation was prepared with Dragon dictation along with smaller phrase technology. Any transcriptional errors that result from this process are unintentional.

## 2016-11-06 NOTE — Progress Notes (Signed)
PHARMACIST - PHYSICIAN COMMUNICATION DR:   Vianne Bulls CONCERNING: Antibiotic IV to Oral Route Change Policy  RECOMMENDATION: This patient is receiving azithromycin by the intravenous route.  Based on criteria approved by the Pharmacy and Therapeutics Committee, the antibiotic(s) is/are being converted to the equivalent oral dose form(s).   DESCRIPTION: These criteria include:  Patient being treated for a respiratory tract infection, urinary tract infection, cellulitis or clostridium difficile associated diarrhea if on metronidazole  The patient is not neutropenic and does not exhibit a GI malabsorption state  The patient is eating (either orally or via tube) and/or has been taking other orally administered medications for a least 24 hours  The patient is improving clinically and has a Tmax < 100.5  If you have questions about this conversion, please contact the Pharmacy Department  []   [phone] )  [facility] [x]   [phone] )  [facility] []   [phone] )  [facility] []   [phone] )  [facility] []   [phone] )  [facility], PharmD Clinical Pharmacist

## 2016-11-06 NOTE — Progress Notes (Addendum)
Pt is being discharged home. Discharge papers given and explained to pt, verbalized understanding. Meds and f/u appointments reviewed with pt. RX given to pt.

## 2016-11-10 LAB — CULTURE, BLOOD (ROUTINE X 2)
CULTURE: NO GROWTH
Culture: NO GROWTH
SPECIAL REQUESTS: ADEQUATE
SPECIAL REQUESTS: ADEQUATE

## 2016-11-11 DIAGNOSIS — J189 Pneumonia, unspecified organism: Secondary | ICD-10-CM | POA: Diagnosis not present

## 2016-11-11 DIAGNOSIS — F5101 Primary insomnia: Secondary | ICD-10-CM | POA: Diagnosis not present

## 2016-11-11 DIAGNOSIS — N400 Enlarged prostate without lower urinary tract symptoms: Secondary | ICD-10-CM | POA: Diagnosis not present

## 2016-11-11 DIAGNOSIS — E039 Hypothyroidism, unspecified: Secondary | ICD-10-CM | POA: Diagnosis not present

## 2016-11-11 DIAGNOSIS — I10 Essential (primary) hypertension: Secondary | ICD-10-CM | POA: Diagnosis not present

## 2016-11-11 DIAGNOSIS — G500 Trigeminal neuralgia: Secondary | ICD-10-CM | POA: Diagnosis not present

## 2016-11-11 DIAGNOSIS — E871 Hypo-osmolality and hyponatremia: Secondary | ICD-10-CM | POA: Diagnosis not present

## 2016-11-11 DIAGNOSIS — R739 Hyperglycemia, unspecified: Secondary | ICD-10-CM | POA: Diagnosis not present

## 2016-11-16 DIAGNOSIS — L910 Hypertrophic scar: Secondary | ICD-10-CM | POA: Diagnosis not present

## 2016-11-25 DIAGNOSIS — G500 Trigeminal neuralgia: Secondary | ICD-10-CM | POA: Diagnosis not present

## 2016-11-25 DIAGNOSIS — E039 Hypothyroidism, unspecified: Secondary | ICD-10-CM | POA: Diagnosis not present

## 2016-11-25 DIAGNOSIS — R739 Hyperglycemia, unspecified: Secondary | ICD-10-CM | POA: Diagnosis not present

## 2016-11-25 DIAGNOSIS — I10 Essential (primary) hypertension: Secondary | ICD-10-CM | POA: Diagnosis not present

## 2016-11-25 DIAGNOSIS — N400 Enlarged prostate without lower urinary tract symptoms: Secondary | ICD-10-CM | POA: Diagnosis not present

## 2016-11-25 DIAGNOSIS — K219 Gastro-esophageal reflux disease without esophagitis: Secondary | ICD-10-CM | POA: Diagnosis not present

## 2016-11-25 DIAGNOSIS — E7800 Pure hypercholesterolemia, unspecified: Secondary | ICD-10-CM | POA: Diagnosis not present

## 2016-11-25 DIAGNOSIS — E790 Hyperuricemia without signs of inflammatory arthritis and tophaceous disease: Secondary | ICD-10-CM | POA: Diagnosis not present

## 2016-11-25 DIAGNOSIS — F5101 Primary insomnia: Secondary | ICD-10-CM | POA: Diagnosis not present

## 2016-12-05 DIAGNOSIS — L910 Hypertrophic scar: Secondary | ICD-10-CM | POA: Diagnosis not present

## 2016-12-05 DIAGNOSIS — Z859 Personal history of malignant neoplasm, unspecified: Secondary | ICD-10-CM | POA: Diagnosis not present

## 2016-12-16 ENCOUNTER — Emergency Department
Admission: EM | Admit: 2016-12-16 | Discharge: 2016-12-16 | Disposition: A | Discharge status: Home / Self Care | Payer: PPO | Attending: Emergency Medicine | Admitting: Emergency Medicine

## 2016-12-16 ENCOUNTER — Encounter: Payer: Self-pay | Admitting: Emergency Medicine

## 2016-12-16 ENCOUNTER — Emergency Department: Payer: PPO

## 2016-12-16 DIAGNOSIS — Z79899 Other long term (current) drug therapy: Secondary | ICD-10-CM | POA: Diagnosis not present

## 2016-12-16 DIAGNOSIS — J189 Pneumonia, unspecified organism: Secondary | ICD-10-CM | POA: Diagnosis not present

## 2016-12-16 DIAGNOSIS — I10 Essential (primary) hypertension: Secondary | ICD-10-CM | POA: Insufficient documentation

## 2016-12-16 DIAGNOSIS — Z800 Family history of malignant neoplasm of digestive organs: Secondary | ICD-10-CM | POA: Diagnosis not present

## 2016-12-16 DIAGNOSIS — R05 Cough: Secondary | ICD-10-CM | POA: Insufficient documentation

## 2016-12-16 DIAGNOSIS — Z86010 Personal history of colonic polyps: Secondary | ICD-10-CM | POA: Diagnosis not present

## 2016-12-16 DIAGNOSIS — R0600 Dyspnea, unspecified: Secondary | ICD-10-CM | POA: Insufficient documentation

## 2016-12-16 DIAGNOSIS — Z8701 Personal history of pneumonia (recurrent): Secondary | ICD-10-CM | POA: Diagnosis not present

## 2016-12-16 DIAGNOSIS — R0602 Shortness of breath: Secondary | ICD-10-CM | POA: Diagnosis not present

## 2016-12-16 HISTORY — DX: Trigeminal neuralgia: G50.0

## 2016-12-16 HISTORY — DX: Essential (primary) hypertension: I10

## 2016-12-16 LAB — BASIC METABOLIC PANEL
ANION GAP: 9 (ref 5–15)
BUN: 15 mg/dL (ref 6–20)
CALCIUM: 9.3 mg/dL (ref 8.9–10.3)
CO2: 23 mmol/L (ref 22–32)
Chloride: 106 mmol/L (ref 101–111)
Creatinine, Ser: 0.85 mg/dL (ref 0.61–1.24)
GFR calc Af Amer: >60 mL/min (ref >60)
GFR calc non Af Amer: >60 mL/min (ref >60)
GLUCOSE: 118 mg/dL — AB (ref 65–99)
Potassium: 4 mmol/L (ref 3.5–5.1)
Sodium: 138 mmol/L (ref 135–145)

## 2016-12-16 LAB — CBC
HEMATOCRIT: 45 % (ref 40.0–52.0)
Hemoglobin: 15.6 g/dL (ref 13.0–18.0)
MCH: 32.6 pg (ref 26.0–34.0)
MCHC: 34.6 g/dL (ref 32.0–36.0)
MCV: 94 fL (ref 80.0–100.0)
Platelets: 129 10*3/uL — ABNORMAL LOW (ref 150–440)
RBC: 4.79 MIL/uL (ref 4.40–5.90)
RDW: 13.3 % (ref 11.5–14.5)
WBC: 7 10*3/uL (ref 3.8–10.6)

## 2016-12-16 NOTE — ED Notes (Signed)
RN entered pt reports he does not want to stay for blood work reports he will follow up with his PCP

## 2016-12-16 NOTE — ED Notes (Signed)
Pt verbalizes understanding of discharge instructions.

## 2016-12-16 NOTE — ED Provider Notes (Signed)
[facility] Emergency Department Provider Note   ____________________________________________   First MD Initiated Contact with Patient 12/16/16 1817     (approximate)  I have reviewed the triage vital signs and the nursing notes.   HISTORY  Chief Complaint Cough and Shortness of Breath    HPI Brian Ihde. is a 71 y.o. male who reports he was here 4 weeks ago for pneumonia he finishes antibiotics good and now gotten little bit worse she is coughing up white phlegm feels somshort of breath. He's not sure if he runs a fever or not. He does not have any other complaints of chest pain or anything else.   Past Medical History:  Diagnosis Date  . Hypertension   . Trigeminal neuralgia     Patient Active Problem List   Diagnosis Date Noted  . Community acquired pneumonia 11/05/2016    No past surgical history on file.  Prior to Admission medications   Medication Sig Start Date End Date Taking? Authorizing Provider  albuterol (PROVENTIL HFA;VENTOLIN HFA) 108 (90 Base) MCG/ACT inhaler Inhale 2 puffs into the lungs every 6 (six) hours as needed for wheezing or shortness of breath. 11/06/16   Epifanio Lesches, MD  allopurinol (ZYLOPRIM) 100 MG tablet Take 1 tablet by mouth daily. 09/18/16   [provider]  amLODipine (NORVASC) 5 MG tablet Take 5 mg by mouth daily.    [provider]  atorvastatin (LIPITOR) 10 MG tablet Take 10 mg by mouth daily.    [provider]  azelastine (ASTELIN) 0.1 % nasal spray Place 2 sprays into both nostrils 2 (two) times daily. Use in each nostril as directed    [provider]  azithromycin (ZITHROMAX) 250 MG tablet Take 1 tablet by mouth daily. 10/31/16   [provider]  benzonatate (TESSALON) 100 MG capsule Take 100 mg by mouth 3 (three) times daily as needed for cough.    [provider]  candesartan (ATACAND) 8 MG tablet Take 1 tablet by mouth 2 (two) times  daily. 10/27/16   [provider]  carbamazepine (CARBATROL) 100 MG 12 hr capsule Take 1 capsule by mouth 4 (four) times daily.  10/26/16   [provider]  carbamazepine (TEGRETOL) 100 MG chewable tablet Chew 100 mg by mouth as needed.    [provider]  cetirizine (ZYRTEC) 10 MG tablet Take 10 mg by mouth daily.    [provider]  fluticasone (FLONASE) 50 MCG/ACT nasal spray Place 2 sprays into both nostrils daily.    [provider]  gabapentin (NEURONTIN) 100 MG capsule Take 100 mg by mouth 3 (three) times daily.    [provider]  levothyroxine (SYNTHROID, LEVOTHROID) 175 MCG tablet Take 175 mcg by mouth daily before breakfast.    [provider]  memantine (NAMENDA) 5 MG tablet Take 5 mg by mouth 2 (two) times daily.    [provider]  predniSONE (STERAPRED UNI-PAK 21 TAB) 10 MG (21) TBPK tablet Taper by 10 mg daily 11/06/16   Epifanio Lesches, MD  promethazine (PHENERGAN) 25 MG tablet Take 25 mg by mouth every 6 (six) hours as needed for nausea or vomiting.    [provider]  rosuvastatin (CRESTOR) 5 MG tablet Take 5 mg by mouth as needed.     [provider]  tamsulosin (FLOMAX) 0.4 MG CAPS capsule Take 0.4 mg by mouth daily.    [provider]  traZODone (DESYREL) 50 MG tablet Take 50 mg  by mouth at bedtime.    [provider]    Allergies [place] grass extract; Other; Statins; Penicillins; and Shellfish-derived products  No family history on file.  Social History Social History  Substance Use Topics  . Smoking status: Never Smoker  . Smokeless tobacco: Never Used  . Alcohol use No    Review of Systems  Constitutional: No fever/chills Eyes: No visual changes. ENT: No sore throat. Cardiovascular: Denies chest pain. Respiratory:complanes of shortness of breath. Gastrointestinal: No abdominal pain.  No nausea, no vomiting.  No diarrhea.  No  constipation. Genitourinary: Negative for dysuria. Musculoskeletal: Negative for back pain. Skin: Negative for rash. Neurological: Negative for headaches, focal weakness   ____________________________________________   PHYSICAL EXAM:  VITAL SIGNS: ED Triage Vitals [12/16/16 1255]  Enc Vitals Group     BP (!) 149/69     Pulse Rate 78     Resp 19     Temp 98.1 F (36.7 C)     Temp Source Oral     SpO2 98 %     Weight 242 lb (109.8 kg)     Height 5\' 4"  (1.626 m)     Head Circumference      Peak Flow      Pain Score      Pain Loc      Pain Edu?      Excl. in [place]?     Constitutional: Alert and oriented. Well appearing and in no acute distress. Eyes: Conjunctivae are normal. PERRL. EOMI. Head: Atraumatic. Nose: No congestion/rhinnorhea. Mouth/Throat: Mucous membranes are moist.  Oropharynx non-erythematous. Neck: No stridor.  Cardiovascular: Normal rate, regular rhythm. Grossly normal heart sounds.  Good peripheral circulation. Respiratory: Normal respiratory effort.  No retractions. Lungs CTAB. Gastrointestinal: Soft and nontender. No distention. No abdominal bruits. No CVA tenderness. Musculoskeletal: No lower extremity tenderness some edema but less than 1+.  No joint effusions. Neurologic:  Normal speech and language. No gross focal neurologic deficits are appreciated. Skin:  Skin is warm, dry and intact. No rash noted. Psychiatric: Mood and affect are normal. Speech and behavior are normal.  ____________________________________________   LABS (all labs ordered are listed, but only abnormal results are displayed)  Labs Reviewed  CBC - Abnormal; Notable for the following:       Result Value   Platelets 129 (*)    All other components within normal limits  BASIC METABOLIC PANEL - Abnormal; Notable for the following:    Glucose, Bld 118 (*)    All other components within normal limits  BRAIN NATRIURETIC PEPTIDE  TROPONIN I  LACTIC ACID, PLASMA  LACTIC ACID,  PLASMA  FIBRIN DERIVATIVES D-DIMER ([facility] ONLY)  URINALYSIS, COMPLETE (UACMP) WITH MICROSCOPIC  HEPATIC FUNCTION PANEL  DIFFERENTIAL   ____________________________________________  EKG  EKG read and interpreted by me shows normal sinus rhythm rate of 80 left axis left bundle-branch block no acute changes similar to prior ____________________________________________  RADIOLOGY  Dg Chest 2 View  Result Date: 12/16/2016 CLINICAL DATA:  Recent pneumonia 4 weeks ago. EXAM: CHEST  2 VIEW COMPARISON:  11/04/2016 FINDINGS: The heart size and mediastinal contours are within normal limits. Both lungs are clear. The visualized skeletal structures are unremarkable. IMPRESSION: No active cardiopulmonary disease. Electronically Signed   By: Kathreen Devoid   On: 12/16/2016 13:21    ____________________________________________   PROCEDURES  Procedure(s) performed:   Procedures  Critical Care performed:   ____________________________________________   INITIAL IMPRESSION / ASSESSMENT AND PLAN / ED COURSE  Pertinent  labs & imaging results that were available during my care of the patient were reviewed by me and considered in my medical decision making (see chart for details).  Patient does not want any further blood work. All of the test done here are normal. His O2 sats are  98/99 on room air. He is not in any distress. He has not coughed while I examine him.he is not tachycardic is not have any chest pain with deep breathing and his lungs are clear on exam and on chest x-rays EKG is unchanged I will him go      ____________________________________________   FINAL CLINICAL IMPRESSION(S) / ED DIAGNOSES  Final diagnoses:  Dyspnea, unspecified type      NEW MEDICATIONS STARTED DURING THIS VISIT:  New Prescriptions   No medications on file     Note:  This document was prepared using Dragon voice recognition software and may include unintentional dictation errors.    Nena Polio, MD 12/16/16 [phone]

## 2016-12-16 NOTE — Discharge Instructions (Signed)
All of your tests that we've done here today so far have been normal. She don't want any other further testing I will let you go home please follow-up with your regular doctor in the next few days. Please return here if you're worse.

## 2016-12-16 NOTE — ED Triage Notes (Signed)
Pt reports recently admitted and treated for pneumonia. Pt reports since discharge a few weeks ago continues to have cough, shortness of breath and weakness. Respirations even and non labored in triage. No apparent distress noted.

## 2016-12-21 DIAGNOSIS — J069 Acute upper respiratory infection, unspecified: Secondary | ICD-10-CM | POA: Diagnosis not present

## 2016-12-27 ENCOUNTER — Emergency Department: Payer: PPO

## 2016-12-27 ENCOUNTER — Other Ambulatory Visit: Payer: Self-pay

## 2016-12-27 ENCOUNTER — Observation Stay (HOSPITAL_BASED_OUTPATIENT_CLINIC_OR_DEPARTMENT_OTHER)
Admit: 2016-12-27 | Discharge: 2016-12-27 | Disposition: A | Discharge status: Home / Self Care | Payer: PPO | Attending: Internal Medicine | Admitting: Internal Medicine

## 2016-12-27 ENCOUNTER — Observation Stay
Admission: EM | Admit: 2016-12-27 | Discharge: 2016-12-28 | Disposition: A | Discharge status: Home / Self Care | Payer: PPO | Attending: Internal Medicine | Admitting: Internal Medicine

## 2016-12-27 ENCOUNTER — Encounter: Payer: Self-pay | Admitting: *Deleted

## 2016-12-27 DIAGNOSIS — I447 Left bundle-branch block, unspecified: Secondary | ICD-10-CM | POA: Insufficient documentation

## 2016-12-27 DIAGNOSIS — J209 Acute bronchitis, unspecified: Secondary | ICD-10-CM | POA: Diagnosis not present

## 2016-12-27 DIAGNOSIS — Z823 Family history of stroke: Secondary | ICD-10-CM | POA: Diagnosis not present

## 2016-12-27 DIAGNOSIS — Z91048 Other nonmedicinal substance allergy status: Secondary | ICD-10-CM | POA: Insufficient documentation

## 2016-12-27 DIAGNOSIS — I110 Hypertensive heart disease with heart failure: Secondary | ICD-10-CM | POA: Diagnosis not present

## 2016-12-27 DIAGNOSIS — I5031 Acute diastolic (congestive) heart failure: Secondary | ICD-10-CM | POA: Insufficient documentation

## 2016-12-27 DIAGNOSIS — N400 Enlarged prostate without lower urinary tract symptoms: Secondary | ICD-10-CM | POA: Insufficient documentation

## 2016-12-27 DIAGNOSIS — Z833 Family history of diabetes mellitus: Secondary | ICD-10-CM | POA: Diagnosis not present

## 2016-12-27 DIAGNOSIS — I872 Venous insufficiency (chronic) (peripheral): Secondary | ICD-10-CM | POA: Insufficient documentation

## 2016-12-27 DIAGNOSIS — Z888 Allergy status to other drugs, medicaments and biological substances status: Secondary | ICD-10-CM | POA: Insufficient documentation

## 2016-12-27 DIAGNOSIS — Z6841 Body Mass Index (BMI) 40.0 and over, adult: Secondary | ICD-10-CM | POA: Insufficient documentation

## 2016-12-27 DIAGNOSIS — Z7951 Long term (current) use of inhaled steroids: Secondary | ICD-10-CM | POA: Insufficient documentation

## 2016-12-27 DIAGNOSIS — J45909 Unspecified asthma, uncomplicated: Secondary | ICD-10-CM | POA: Diagnosis not present

## 2016-12-27 DIAGNOSIS — Z91013 Allergy to seafood: Secondary | ICD-10-CM | POA: Diagnosis not present

## 2016-12-27 DIAGNOSIS — E669 Obesity, unspecified: Secondary | ICD-10-CM | POA: Diagnosis not present

## 2016-12-27 DIAGNOSIS — R0602 Shortness of breath: Secondary | ICD-10-CM | POA: Diagnosis not present

## 2016-12-27 DIAGNOSIS — I10 Essential (primary) hypertension: Secondary | ICD-10-CM

## 2016-12-27 DIAGNOSIS — J40 Bronchitis, not specified as acute or chronic: Secondary | ICD-10-CM

## 2016-12-27 DIAGNOSIS — R0603 Acute respiratory distress: Principal | ICD-10-CM | POA: Diagnosis present

## 2016-12-27 DIAGNOSIS — R05 Cough: Secondary | ICD-10-CM

## 2016-12-27 DIAGNOSIS — J189 Pneumonia, unspecified organism: Secondary | ICD-10-CM | POA: Diagnosis not present

## 2016-12-27 DIAGNOSIS — K449 Diaphragmatic hernia without obstruction or gangrene: Secondary | ICD-10-CM | POA: Insufficient documentation

## 2016-12-27 DIAGNOSIS — M109 Gout, unspecified: Secondary | ICD-10-CM | POA: Diagnosis not present

## 2016-12-27 DIAGNOSIS — I700 Atherosclerosis of aorta: Secondary | ICD-10-CM | POA: Diagnosis not present

## 2016-12-27 DIAGNOSIS — E785 Hyperlipidemia, unspecified: Secondary | ICD-10-CM | POA: Insufficient documentation

## 2016-12-27 DIAGNOSIS — Z79899 Other long term (current) drug therapy: Secondary | ICD-10-CM | POA: Diagnosis not present

## 2016-12-27 DIAGNOSIS — I5189 Other ill-defined heart diseases: Secondary | ICD-10-CM

## 2016-12-27 DIAGNOSIS — Z8701 Personal history of pneumonia (recurrent): Secondary | ICD-10-CM | POA: Insufficient documentation

## 2016-12-27 DIAGNOSIS — Z880 Allergy status to penicillin: Secondary | ICD-10-CM | POA: Insufficient documentation

## 2016-12-27 DIAGNOSIS — R9431 Abnormal electrocardiogram [ECG] [EKG]: Secondary | ICD-10-CM | POA: Diagnosis not present

## 2016-12-27 DIAGNOSIS — E871 Hypo-osmolality and hyponatremia: Secondary | ICD-10-CM | POA: Diagnosis not present

## 2016-12-27 DIAGNOSIS — E039 Hypothyroidism, unspecified: Secondary | ICD-10-CM | POA: Diagnosis not present

## 2016-12-27 HISTORY — DX: Obesity, unspecified: E66.9

## 2016-12-27 HISTORY — DX: Hypothyroidism, unspecified: E03.9

## 2016-12-27 HISTORY — DX: Dizziness and giddiness: R42

## 2016-12-27 HISTORY — DX: Hyperlipidemia, unspecified: E78.5

## 2016-12-27 LAB — BASIC METABOLIC PANEL
Anion gap: 10 (ref 5–15)
BUN: 13 mg/dL (ref 6–20)
CHLORIDE: 99 mmol/L — AB (ref 101–111)
CO2: 22 mmol/L (ref 22–32)
CREATININE: 0.92 mg/dL (ref 0.61–1.24)
Calcium: 9.3 mg/dL (ref 8.9–10.3)
Glucose, Bld: 122 mg/dL — ABNORMAL HIGH (ref 65–99)
Potassium: 4 mmol/L (ref 3.5–5.1)
SODIUM: 131 mmol/L — AB (ref 135–145)

## 2016-12-27 LAB — CBC
HCT: 44.6 % (ref 40.0–52.0)
Hemoglobin: 15.9 g/dL (ref 13.0–18.0)
MCH: 32.7 pg (ref 26.0–34.0)
MCHC: 35.7 g/dL (ref 32.0–36.0)
MCV: 91.7 fL (ref 80.0–100.0)
PLATELETS: 139 10*3/uL — AB (ref 150–440)
RBC: 4.86 MIL/uL (ref 4.40–5.90)
RDW: 13.4 % (ref 11.5–14.5)
WBC: 6.4 10*3/uL (ref 3.8–10.6)

## 2016-12-27 LAB — BRAIN NATRIURETIC PEPTIDE: B Natriuretic Peptide: 27 pg/mL (ref 0.0–100.0)

## 2016-12-27 LAB — TSH: TSH: 0.15 u[IU]/mL — ABNORMAL LOW (ref 0.350–4.500)

## 2016-12-27 LAB — TROPONIN I

## 2016-12-27 MED ORDER — IOPAMIDOL (ISOVUE-370) INJECTION 76%
75.0000 mL | Freq: Once | INTRAVENOUS | Status: AC | PRN
  Administered 2016-12-27: 75 mL via INTRAVENOUS

## 2016-12-27 MED ORDER — ALBUTEROL SULFATE (2.5 MG/3ML) 0.083% IN NEBU
2.5000 mg | INHALATION_SOLUTION | RESPIRATORY_TRACT | Status: DC | PRN
Start: 2016-12-27 — End: 2016-12-28

## 2016-12-27 MED ORDER — ATORVASTATIN CALCIUM 20 MG PO TABS
10.0000 mg | ORAL_TABLET | Freq: Every day | ORAL | Status: DC
  Administered 2016-12-27 – 2016-12-28 (×2): 10 mg via ORAL
  Filled 2016-12-27 (×2): qty 1

## 2016-12-27 MED ORDER — ALBUTEROL SULFATE (2.5 MG/3ML) 0.083% IN NEBU
2.5000 mg | INHALATION_SOLUTION | RESPIRATORY_TRACT | Status: AC
  Administered 2016-12-27 (×3): 2.5 mg via RESPIRATORY_TRACT
  Filled 2016-12-27 (×3): qty 3

## 2016-12-27 MED ORDER — LEVOTHYROXINE SODIUM 100 MCG PO TABS
175.0000 ug | ORAL_TABLET | Freq: Every day | ORAL | Status: DC
  Administered 2016-12-27: 11:00:00 175 ug via ORAL
  Filled 2016-12-27: qty 1

## 2016-12-27 MED ORDER — AMLODIPINE BESYLATE 5 MG PO TABS
5.0000 mg | ORAL_TABLET | Freq: Every day | ORAL | Status: DC
  Administered 2016-12-27 – 2016-12-28 (×2): 5 mg via ORAL
  Filled 2016-12-27 (×2): qty 1

## 2016-12-27 MED ORDER — NITROGLYCERIN 2 % TD OINT
1.0000 [in_us] | TOPICAL_OINTMENT | Freq: Once | TRANSDERMAL | Status: AC
  Administered 2016-12-27: 1 [in_us] via TOPICAL

## 2016-12-27 MED ORDER — GUAIFENESIN 100 MG/5ML PO SOLN
5.0000 mL | ORAL | Status: DC | PRN
  Administered 2016-12-28: 100 mg via ORAL
  Filled 2016-12-27 (×2): qty 5

## 2016-12-27 MED ORDER — DOCUSATE SODIUM 100 MG PO CAPS
100.0000 mg | ORAL_CAPSULE | Freq: Two times a day (BID) | ORAL | Status: DC
  Administered 2016-12-27 – 2016-12-28 (×3): 100 mg via ORAL
  Filled 2016-12-27 (×3): qty 1

## 2016-12-27 MED ORDER — ONDANSETRON HCL 4 MG PO TABS: 4.0000 mg | ORAL_TABLET | Freq: Four times a day (QID) | ORAL | Status: DC | PRN

## 2016-12-27 MED ORDER — FUROSEMIDE 10 MG/ML IJ SOLN
INTRAMUSCULAR | Status: AC
  Filled 2016-12-27: qty 4

## 2016-12-27 MED ORDER — BENZONATATE 100 MG PO CAPS
100.0000 mg | ORAL_CAPSULE | Freq: Three times a day (TID) | ORAL | Status: DC | PRN
  Administered 2016-12-27 (×2): 100 mg via ORAL
  Filled 2016-12-27 (×2): qty 1

## 2016-12-27 MED ORDER — ACETAMINOPHEN 325 MG PO TABS
650.0000 mg | ORAL_TABLET | Freq: Four times a day (QID) | ORAL | Status: DC | PRN
  Administered 2016-12-27: 650 mg via ORAL
  Filled 2016-12-27: qty 2

## 2016-12-27 MED ORDER — CIPROFLOXACIN IN D5W 400 MG/200ML IV SOLN
400.0000 mg | Freq: Two times a day (BID) | INTRAVENOUS | Status: DC
  Administered 2016-12-27 – 2016-12-28 (×3): 400 mg via INTRAVENOUS
  Filled 2016-12-27 (×5): qty 200

## 2016-12-27 MED ORDER — IPRATROPIUM-ALBUTEROL 0.5-2.5 (3) MG/3ML IN SOLN
3.0000 mL | Freq: Once | RESPIRATORY_TRACT | Status: AC
  Administered 2016-12-27: 3 mL via RESPIRATORY_TRACT

## 2016-12-27 MED ORDER — CARBAMAZEPINE ER 100 MG PO TB12
100.0000 mg | ORAL_TABLET | Freq: Four times a day (QID) | ORAL | Status: DC
Start: 2016-12-27 — End: 2016-12-28
  Administered 2016-12-27 – 2016-12-28 (×6): 100 mg via ORAL
  Filled 2016-12-27 (×8): qty 1

## 2016-12-27 MED ORDER — IRBESARTAN 150 MG PO TABS
75.0000 mg | ORAL_TABLET | Freq: Every day | ORAL | Status: DC
  Administered 2016-12-27 – 2016-12-28 (×2): 75 mg via ORAL
  Filled 2016-12-27 (×3): qty 1

## 2016-12-27 MED ORDER — IPRATROPIUM-ALBUTEROL 0.5-2.5 (3) MG/3ML IN SOLN
RESPIRATORY_TRACT | Status: AC
  Administered 2016-12-27: 3 mL via RESPIRATORY_TRACT
  Filled 2016-12-27: qty 6

## 2016-12-27 MED ORDER — LORATADINE 10 MG PO TABS
10.0000 mg | ORAL_TABLET | Freq: Every day | ORAL | Status: DC
  Administered 2016-12-27 – 2016-12-28 (×2): 10 mg via ORAL
  Filled 2016-12-27 (×2): qty 1

## 2016-12-27 MED ORDER — MEMANTINE HCL 5 MG PO TABS
5.0000 mg | ORAL_TABLET | Freq: Two times a day (BID) | ORAL | Status: DC
  Administered 2016-12-27 – 2016-12-28 (×3): 5 mg via ORAL
  Filled 2016-12-27 (×3): qty 1

## 2016-12-27 MED ORDER — FLUTICASONE PROPIONATE 50 MCG/ACT NA SUSP
2.0000 | Freq: Every day | NASAL | Status: DC
  Administered 2016-12-27 – 2016-12-28 (×2): 2 via NASAL
  Filled 2016-12-27: qty 16

## 2016-12-27 MED ORDER — ONDANSETRON HCL 4 MG/2ML IJ SOLN: 4.0000 mg | Freq: Four times a day (QID) | INTRAMUSCULAR | Status: DC | PRN

## 2016-12-27 MED ORDER — FAMOTIDINE 20 MG PO TABS
40.0000 mg | ORAL_TABLET | Freq: Every day | ORAL | Status: DC
  Administered 2016-12-27 – 2016-12-28 (×2): 40 mg via ORAL
  Filled 2016-12-27 (×2): qty 2

## 2016-12-27 MED ORDER — ALLOPURINOL 100 MG PO TABS
100.0000 mg | ORAL_TABLET | Freq: Every day | ORAL | Status: DC
  Administered 2016-12-27 – 2016-12-28 (×2): 100 mg via ORAL
  Filled 2016-12-27 (×2): qty 1

## 2016-12-27 MED ORDER — METHYLPREDNISOLONE SODIUM SUCC 40 MG IJ SOLR
40.0000 mg | Freq: Two times a day (BID) | INTRAMUSCULAR | Status: DC
  Administered 2016-12-27 – 2016-12-28 (×3): 40 mg via INTRAVENOUS
  Filled 2016-12-27 (×3): qty 1

## 2016-12-27 MED ORDER — ENOXAPARIN SODIUM 40 MG/0.4ML ~~LOC~~ SOLN
40.0000 mg | SUBCUTANEOUS | Status: DC
  Administered 2016-12-27: 11:00:00 40 mg via SUBCUTANEOUS
  Filled 2016-12-27: qty 0.4

## 2016-12-27 MED ORDER — NITROGLYCERIN 2 % TD OINT
TOPICAL_OINTMENT | TRANSDERMAL | Status: AC
  Filled 2016-12-27: qty 1

## 2016-12-27 MED ORDER — ENOXAPARIN SODIUM 40 MG/0.4ML ~~LOC~~ SOLN
40.0000 mg | Freq: Two times a day (BID) | SUBCUTANEOUS | Status: DC
  Administered 2016-12-27 – 2016-12-28 (×2): 40 mg via SUBCUTANEOUS
  Filled 2016-12-27 (×2): qty 0.4

## 2016-12-27 MED ORDER — AZELASTINE HCL 0.1 % NA SOLN
2.0000 | Freq: Two times a day (BID) | NASAL | Status: DC
  Administered 2016-12-27 – 2016-12-28 (×2): 2 via NASAL
  Filled 2016-12-27: qty 30

## 2016-12-27 MED ORDER — TRAZODONE HCL 50 MG PO TABS
50.0000 mg | ORAL_TABLET | Freq: Every day | ORAL | Status: DC
  Administered 2016-12-27: 21:00:00 50 mg via ORAL
  Filled 2016-12-27: qty 1

## 2016-12-27 MED ORDER — GABAPENTIN 100 MG PO CAPS
100.0000 mg | ORAL_CAPSULE | Freq: Three times a day (TID) | ORAL | Status: DC
  Administered 2016-12-27 – 2016-12-28 (×4): 100 mg via ORAL
  Filled 2016-12-27 (×4): qty 1

## 2016-12-27 MED ORDER — TIOTROPIUM BROMIDE MONOHYDRATE 18 MCG IN CAPS
18.0000 ug | ORAL_CAPSULE | Freq: Every day | RESPIRATORY_TRACT | Status: DC
  Administered 2016-12-27 – 2016-12-28 (×2): 18 ug via RESPIRATORY_TRACT
  Filled 2016-12-27: qty 5

## 2016-12-27 MED ORDER — EZETIMIBE 10 MG PO TABS
5.0000 mg | ORAL_TABLET | Freq: Every day | ORAL | Status: DC
  Administered 2016-12-27 – 2016-12-28 (×2): 5 mg via ORAL
  Filled 2016-12-27 (×2): qty 0.5

## 2016-12-27 MED ORDER — IPRATROPIUM-ALBUTEROL 0.5-2.5 (3) MG/3ML IN SOLN
3.0000 mL | Freq: Four times a day (QID) | RESPIRATORY_TRACT | Status: DC
  Administered 2016-12-27 – 2016-12-28 (×5): 3 mL via RESPIRATORY_TRACT
  Filled 2016-12-27 (×5): qty 3

## 2016-12-27 MED ORDER — ACETAMINOPHEN 650 MG RE SUPP: 650.0000 mg | Freq: Four times a day (QID) | RECTAL | Status: DC | PRN

## 2016-12-27 MED ORDER — FUROSEMIDE 10 MG/ML IJ SOLN
40.0000 mg | Freq: Once | INTRAMUSCULAR | Status: AC
  Administered 2016-12-27: 40 mg via INTRAVENOUS

## 2016-12-27 MED ORDER — TAMSULOSIN HCL 0.4 MG PO CAPS
0.4000 mg | ORAL_CAPSULE | Freq: Every day | ORAL | Status: DC
  Administered 2016-12-27 – 2016-12-28 (×2): 0.4 mg via ORAL
  Filled 2016-12-27 (×2): qty 1

## 2016-12-27 NOTE — ED Provider Notes (Addendum)
[facility] Emergency Department Provider Note    First MD Initiated Contact with Patient 12/27/16 [phone]     (approximate)  I have reviewed the triage vital signs and the nursing notes.   HISTORY  Chief Complaint Shortness of Breath    HPI Brian Huffman. is a 71 y.o. male with below list of chronic medical conditions presents to the emergency department with acute onset of dyspnea and chest tightness tonight. Patient admits to orthopnea times weeks. Patient presents with tachypnea respiratory rate currently 32 oxygen saturation 93%. Patient denies any fever. Of note patient currently being treated for pneumonia on his second round of antibiotics since July 20. Patient states his current chest discomfort is 6 out of 10   Past Medical History:  Diagnosis Date  . Hypertension   . Trigeminal neuralgia     Patient Active Problem List   Diagnosis Date Noted  . Community acquired pneumonia 11/05/2016    No past surgical history on file.  Prior to Admission medications   Medication Sig Start Date End Date Taking? Authorizing Provider  albuterol (PROVENTIL HFA;VENTOLIN HFA) 108 (90 Base) MCG/ACT inhaler Inhale 2 puffs into the lungs every 6 (six) hours as needed for wheezing or shortness of breath. 11/06/16  Yes Epifanio Lesches, MD  allopurinol (ZYLOPRIM) 100 MG tablet Take 1 tablet by mouth daily. 09/18/16  Yes [provider]  amLODipine (NORVASC) 5 MG tablet Take 5 mg by mouth daily.   Yes [provider]  atorvastatin (LIPITOR) 10 MG tablet Take 10 mg by mouth daily.   Yes [provider]  azelastine (ASTELIN) 0.1 % nasal spray Place 2 sprays into both nostrils 2 (two) times daily. Use in each nostril as directed   Yes [provider]  benzonatate (TESSALON) 100 MG capsule Take 100 mg by mouth 3 (three) times daily as needed for cough.   Yes [provider]  candesartan (ATACAND) 8 MG tablet Take 1  tablet by mouth 2 (two) times daily. 10/27/16  Yes [provider]  carbamazepine (TEGRETOL XR) 100 MG 12 hr tablet Take 100 mg by mouth 4 (four) times daily.   Yes [provider]  cetirizine (ZYRTEC) 10 MG tablet Take 10 mg by mouth daily.   Yes [provider]  ezetimibe (ZETIA) 10 MG tablet Take 5 mg by mouth daily.   Yes [provider]  fluticasone (FLONASE) 50 MCG/ACT nasal spray Place 2 sprays into both nostrils daily.   Yes [provider]  gabapentin (NEURONTIN) 100 MG capsule Take 100 mg by mouth 3 (three) times daily.   Yes [provider]  levofloxacin (LEVAQUIN) 500 MG tablet Take 500 mg by mouth daily. 12/21/16 12/31/16 Yes [provider]  levothyroxine (SYNTHROID, LEVOTHROID) 175 MCG tablet Take 175 mcg by mouth daily before breakfast.   Yes [provider]  memantine (NAMENDA) 5 MG tablet Take 5 mg by mouth 2 (two) times daily.   Yes [provider]  promethazine (PHENERGAN) 25 MG tablet Take 25 mg by mouth every 6 (six) hours as needed for nausea or vomiting.   Yes [provider]  ranitidine (ZANTAC) 300 MG tablet Take 300 mg by mouth at bedtime.   Yes [provider]  tamsulosin (FLOMAX) 0.4 MG CAPS capsule Take 0.4 mg by mouth daily.   Yes [provider]  traZODone (DESYREL) 50 MG tablet Take 50 mg by mouth at bedtime.   Yes [provider]  predniSONE (  STERAPRED UNI-PAK 21 TAB) 10 MG (21) TBPK tablet Taper by 10 mg daily Patient not taking: Reported on 12/27/2016 11/06/16   Epifanio Lesches, MD    Allergies [place] grass extract; Other; Statins; Penicillins; and Shellfish-derived products  No family history on file.  Social History Social History  Substance Use Topics  . Smoking status: Never Smoker  . Smokeless tobacco: Never Used  . Alcohol use No    Review of Systems Constitutional: No fever/chills Eyes: No visual changes. ENT: No sore  throat. Cardiovascular: Positive for chest tightness Respiratory: Positive for shortness of breath. Gastrointestinal: No abdominal pain.  No nausea, no vomiting.  No diarrhea.  No constipation. Genitourinary: Negative for dysuria. Musculoskeletal: Negative for neck pain.  Negative for back pain. Integumentary: Negative for rash. Neurological: Negative for headaches, focal weakness or numbness.   ____________________________________________   PHYSICAL EXAM:  VITAL SIGNS: ED Triage Vitals  Enc Vitals Group     BP 12/27/16 0251 (!) 183/104     Pulse Rate 12/27/16 0251 92     Resp 12/27/16 0251 (!) 24     Temp 12/27/16 0251 98.6 F (37 C)     Temp Source 12/27/16 0251 Oral     SpO2 12/27/16 0251 98 %     Weight 12/27/16 0246 109.8 kg (242 lb)     Height 12/27/16 0246 1.626 m (5\' 4" )     Head Circumference --      Peak Flow --      Pain Score --      Pain Loc --      Pain Edu? --      Excl. in [place]? --     Constitutional: Alert and oriented. Apparent respiratory distress Eyes: Conjunctivae are normal.  Head: Atraumatic. Mouth/Throat: Mucous membranes are moist.  Oropharynx non-erythematous. Neck: No stridor.   Cardiovascular: Normal rate, regular rhythm. Good peripheral circulation. Grossly normal heart sounds. Respiratory: Tachypnea, positive accessory respiratory muscle use  No retractions. Diffuse rhonchi Gastrointestinal: Soft and nontender. No distention.  Musculoskeletal: No lower extremity tenderness nor edema. No gross deformities of extremities. Neurologic:  Normal speech and language. No gross focal neurologic deficits are appreciated.  Skin:  Skin is warm, dry and intact. No rash noted. Psychiatric: Mood and affect are normal. Speech and behavior are normal.  ____________________________________________   LABS (all labs ordered are listed, but only abnormal results are displayed)  Labs Reviewed  BASIC METABOLIC PANEL - Abnormal; Notable for the following:        Result Value   Sodium 131 (*)    Chloride 99 (*)    Glucose, Bld 122 (*)    All other components within normal limits  CBC - Abnormal; Notable for the following:    Platelets 139 (*)    All other components within normal limits  TROPONIN I  BRAIN NATRIURETIC PEPTIDE   ____________________________________________  EKG  ED ECG REPORT I, [place] N Naina Sleeper, the attending physician, personally viewed and interpreted this ECG.   Date: 12/27/2016  EKG Time: 2:30 AM  Rate: 94  Rhythm: Normal sinus rhythm with left bundle-branch block  Axis: Normal  Intervals: Normal  ST&T Change: None  ____________________________________________  RADIOLOGY I, Marion N Wylee Ogden, personally viewed and evaluated these images (plain radiographs) as part of my medical decision making, as well as reviewing the written report by the radiologist.  Ct Angio Chest Pe W And/or Wo Contrast  Result Date: 12/27/2016 CLINICAL DATA:  Shortness of breath, on Levaquin for pneumonia. EXAM: CT  ANGIOGRAPHY CHEST WITH CONTRAST TECHNIQUE: Multidetector CT imaging of the chest was performed using the standard protocol during bolus administration of intravenous contrast. Multiplanar CT image reconstructions and MIPs were obtained to evaluate the vascular anatomy. CONTRAST:  75 cc Isovue 370 COMPARISON:  Chest radiograph December 27, 2016 at 0248 hours FINDINGS: CARDIOVASCULAR: Adequate contrast opacification of the pulmonary artery's. Main pulmonary artery is not enlarged. No pulmonary arterial filling defects to the level of the subsegmental branches. Heart size is normal, no right heart strain. No pericardial effusion. Thoracic aorta is normal course and caliber, mild intimal thickening and calcific atherosclerosis. MEDIASTINUM/NODES: No lymphadenopathy by CT size criteria. 10 mm short access RIGHT peritracheal lymph node, nonspecific. LUNGS/PLEURA: Tracheobronchial tree is patent, no pneumothorax. Mild bronchial wall  thickening. No pleural effusions, focal consolidations, pulmonary nodules or masses. UPPER ABDOMEN: Mildly hypodense liver seen with steatosis. No acute process in the upper abdomen. Small hiatal hernia. MUSCULOSKELETAL: Visualized soft tissues and included osseous structures appear normal. Review of the MIP images confirms the above findings. IMPRESSION: Item 1. No acute pulmonary embolism. 2. Bronchial wall thickening seen with bronchitis and reactive airway disease. No focal consolidation. Aortic Atherosclerosis (ICD10-I70.0). Electronically Signed   By: Elon Alas M.D.   On: 12/27/2016 04:50   Dg Chest Port 1 View  Result Date: 12/27/2016 CLINICAL DATA:  Pneumonia. Shortness of breath. History of hypertension. EXAM: PORTABLE CHEST 1 VIEW COMPARISON:  Chest radiograph December 16, 2016 FINDINGS: The heart size and mediastinal contours are within normal limits. Both lungs are clear. Minimal LEFT lung base scarring. The visualized skeletal structures are unremarkable. IMPRESSION: No acute cardiopulmonary process. Electronically Signed   By: Elon Alas M.D.   On: 12/27/2016 03:35    ____________________________________________   PROCEDURES  Critical Care performed: CRITICAL CARE Performed by: Gregor Hams   Total critical care time: 30 minutes  Critical care time was exclusive of separately billable procedures and treating other patients.  Critical care was necessary to treat or prevent imminent or life-threatening deterioration.  Critical care was time spent personally by me on the following activities: development of treatment plan with patient and/or surrogate as well as nursing, discussions with consultants, evaluation of patient's response to treatment, examination of patient, obtaining history from patient or surrogate, ordering and performing treatments and interventions, ordering and review of laboratory studies, ordering and review of radiographic studies, pulse  oximetry and re-evaluation of patient's condition.   Procedures   ____________________________________________   INITIAL IMPRESSION / ASSESSMENT AND PLAN / ED COURSE  Pertinent labs & imaging results that were available during my care of the patient were reviewed by me and considered in my medical decision making (see chart for details).   71 year old male presenting to the emergency department in respiratory distress with concern for possible normal onset CHF versus pneumonia. Chest x-ray revealed no evidence of pneumonia. EKG revealed left bundle branch block. Troponin negative Patient noted to be hypertensive. Patient given Lasix 1 inch Nitrol Place applied to the chest to DuoNeb's given with improvement of respiratory status. Patient discussed with Dr. Marcille Blanco for hospital admission for further evaluation and management     ____________________________________________  FINAL CLINICAL IMPRESSION(S) / ED DIAGNOSES  Final diagnoses:  Respiratory distress     MEDICATIONS GIVEN DURING THIS VISIT:  Medications  ipratropium-albuterol (DUONEB) 0.5-2.5 (3) MG/3ML nebulizer solution 3 mL (3 mLs Nebulization Given 12/27/16 0318)  nitroGLYCERIN (NITROGLYN) 2 % ointment 1 inch (1 inch Topical Given 12/27/16 0256)  furosemide (LASIX) injection 40 mg (  40 mg Intravenous Given 12/27/16 0256)  ipratropium-albuterol (DUONEB) 0.5-2.5 (3) MG/3ML nebulizer solution 3 mL (3 mLs Nebulization Given 12/27/16 0319)  iopamidol (ISOVUE-370) 76 % injection 75 mL (75 mLs Intravenous Contrast Given 12/27/16 0428)     NEW OUTPATIENT MEDICATIONS STARTED DURING THIS VISIT:  New Prescriptions   No medications on file    Modified Medications   No medications on file    Discontinued Medications   AZITHROMYCIN (ZITHROMAX) 250 MG TABLET    Take 1 tablet by mouth daily.   CARBAMAZEPINE (CARBATROL) 100 MG 12 HR CAPSULE    Take 1 capsule by mouth 4 (four) times daily.    CARBAMAZEPINE (TEGRETOL) 100 MG  CHEWABLE TABLET    Chew 100 mg by mouth as needed.   ROSUVASTATIN (CRESTOR) 5 MG TABLET    Take 5 mg by mouth as needed.      Note:  This document was prepared using Dragon voice recognition software and may include unintentional dictation errors.    Gregor Hams, MD 12/27/16 [id number]    Gregor Hams, MD 12/27/16 [phone]

## 2016-12-27 NOTE — Consult Note (Signed)
Pulmonary Critical Care  Initial Consult Note   Kristoff Coonradt. RXV:[id number] DOB: Dec 05, 1945 DOA: 12/27/2016  Referring physician: Rosilyn Mings MD PCP: Elaina Pattee, MD   Chief Complaint:  Shortness of breath  HPI: Brian Huffman. is a 71 y.o. male  With prior history of hypertension presented to the hospital with increasing shortness of breath.  States that he apparently was diagnosed with pneumonia recently and this has subsequently had some improvement.  He still has been having some cough however.  States when he is coughing a feels as though he is choking.  Patient has had sputum production.  Patient was noted to be significantly hypoxic in the emergency department.  Chest x-ray was done which was unremarkable.  CT scan of the chest was done also which did not show any significant infiltrates.  Patient has been having orthopnea.  Patient was seen by Cardiology and it was felt that cardiac causes not the primary cause for his symptoms.  There is no clear-cut prior history of asthma or bronchitis.  He states however that he does feel that better with inhaler therapy.   Review of Systems:  Constitutional:  No weight loss, night sweats, Fevers, chills, fatigue.  HEENT:  No headaches, nasal congestion, post nasal drip,  Cardio-vascular:  No chest pain, +Orthopnea, PND, swelling in lower extremities, anasarca, dizziness, palpitations  GI:  No heartburn, indigestion, abdominal pain, nausea, vomiting, diarrhea  Resp:  +shortness of breath no productive cough, No coughing up of blood.+wheezing Skin:  no rash or lesions.  Musculoskeletal:  No joint pain or swelling.   Remainder ROS performed and is unremarkable other than noted in HPI  Past Medical History:  Diagnosis Date  . HLD (hyperlipidemia)   . Hypertension   . Hypothyroidism   . Obesity   . Trigeminal neuralgia   . Vertigo    History reviewed. No pertinent surgical history. Social History:  reports  that he has never smoked. He has never used smokeless tobacco. He reports that he does not drink alcohol or use drugs.  Allergies  Allergen Reactions  . [place] Grass Extract Other (See Comments)    Difficulty breathing  . Other Other (See Comments)    DUST, sneezing, watery eyes  . Statins Other (See Comments)    Atorvastatin, simvastatin and pravastatin    . Penicillins Rash  . Shellfish-Derived Products Rash    Family History  Problem Relation Age of Onset  . Stroke Mother   . Diabetes Mellitus II Father   . Diabetes Mellitus II Sister     Prior to Admission medications   Medication Sig Start Date End Date Taking? Authorizing Provider  albuterol (PROVENTIL HFA;VENTOLIN HFA) 108 (90 Base) MCG/ACT inhaler Inhale 2 puffs into the lungs every 6 (six) hours as needed for wheezing or shortness of breath. 11/06/16  Yes Epifanio Lesches, MD  allopurinol (ZYLOPRIM) 100 MG tablet Take 1 tablet by mouth daily. 09/18/16  Yes [provider]  amLODipine (NORVASC) 5 MG tablet Take 5 mg by mouth daily.   Yes [provider]  atorvastatin (LIPITOR) 10 MG tablet Take 10 mg by mouth daily.   Yes [provider]  azelastine (ASTELIN) 0.1 % nasal spray Place 2 sprays into both nostrils 2 (two) times daily. Use in each nostril as directed   Yes [provider]  benzonatate (TESSALON) 100 MG capsule Take 100 mg by mouth 3 (three) times daily as needed for cough.   Yes [provider]  candesartan (ATACAND) 8 MG tablet Take 1 tablet by mouth 2 (two) times daily. 10/27/16  Yes [provider]  carbamazepine (TEGRETOL XR) 100 MG 12 hr tablet Take 100 mg by mouth 4 (four) times daily.   Yes [provider]  cetirizine (ZYRTEC) 10 MG tablet Take 10 mg by mouth daily.   Yes [provider]  ezetimibe (ZETIA) 10 MG tablet Take 5 mg by mouth daily.   Yes [provider]  fluticasone (FLONASE) 50 MCG/ACT nasal spray Place 2 sprays  into both nostrils daily.   Yes [provider]  gabapentin (NEURONTIN) 100 MG capsule Take 100 mg by mouth 3 (three) times daily.   Yes [provider]  levofloxacin (LEVAQUIN) 500 MG tablet Take 500 mg by mouth daily. 12/21/16 12/31/16 Yes [provider]  levothyroxine (SYNTHROID, LEVOTHROID) 175 MCG tablet Take 175 mcg by mouth daily before breakfast.   Yes [provider]  memantine (NAMENDA) 5 MG tablet Take 5 mg by mouth 2 (two) times daily.   Yes [provider]  promethazine (PHENERGAN) 25 MG tablet Take 25 mg by mouth every 6 (six) hours as needed for nausea or vomiting.   Yes [provider]  ranitidine (ZANTAC) 300 MG tablet Take 300 mg by mouth at bedtime.   Yes [provider]  tamsulosin (FLOMAX) 0.4 MG CAPS capsule Take 0.4 mg by mouth daily.   Yes [provider]  traZODone (DESYREL) 50 MG tablet Take 50 mg by mouth at bedtime.   Yes [provider]  predniSONE (STERAPRED UNI-PAK 21 TAB) 10 MG (21) TBPK tablet Taper by 10 mg daily Patient not taking: Reported on 12/27/2016 11/06/16   Epifanio Lesches, MD   Physical Exam: Vitals:   12/27/16 0654 12/27/16 0730 12/27/16 0735 12/27/16 0740  BP: 137/76     Pulse: 81     Resp:      Temp:      TempSrc:      SpO2: 99% 97% 97% 97%  Weight:      Height:        Wt Readings from Last 3 Encounters:  12/27/16 109.8 kg (242 lb)  12/16/16 109.8 kg (242 lb)  11/05/16 110.3 kg (243 lb 3.2 oz)    General:  Appears calm and comfortable Eyes: PERRL, normal lids, irises & conjunctiva ENT: grossly normal hearing, lips & tongue Neck: no LAD, masses or thyromegaly Cardiovascular: RRR, no m/r/g. No LE edema. Respiratory: CTA bilaterally, no w/r/r. Normal respiratory effort. Abdomen: soft, nontender Skin: no rash or induration seen on limited exam Musculoskeletal: grossly normal tone BUE/BLE Psychiatric: grossly normal mood and affect Neurologic: grossly  non-focal.          Labs on Admission:  Basic Metabolic Panel:  Recent Labs Lab 12/27/16 0251  NA 131*  K 4.0  CL 99*  CO2 22  GLUCOSE 122*  BUN 13  CREATININE 0.92  CALCIUM 9.3   Liver Function Tests: No results for input(s): AST, ALT, ALKPHOS, BILITOT, PROT, ALBUMIN in the last 168 hours. No results for input(s): LIPASE, AMYLASE in the last 168 hours. No results for input(s): AMMONIA in the last 168 hours. CBC:  Recent Labs Lab 12/27/16 0251  WBC 6.4  HGB 15.9  HCT 44.6  MCV 91.7  PLT 139*   Cardiac Enzymes:  Recent Labs Lab 12/27/16 0251  TROPONINI <0.03    BNP (last 3 results)  Recent Labs  11/04/16 2105 12/27/16 0251  BNP 12.0 27.0  ProBNP (last 3 results) No results for input(s): PROBNP in the last 8760 hours.  CBG: No results for input(s): GLUCAP in the last 168 hours.  Radiological Exams on Admission: Ct Angio Chest Pe W And/or Wo Contrast  Result Date: 12/27/2016 CLINICAL DATA:  Shortness of breath, on Levaquin for pneumonia. EXAM: CT ANGIOGRAPHY CHEST WITH CONTRAST TECHNIQUE: Multidetector CT imaging of the chest was performed using the standard protocol during bolus administration of intravenous contrast. Multiplanar CT image reconstructions and MIPs were obtained to evaluate the vascular anatomy. CONTRAST:  75 cc Isovue 370 COMPARISON:  Chest radiograph December 27, 2016 at 0248 hours FINDINGS: CARDIOVASCULAR: Adequate contrast opacification of the pulmonary artery's. Main pulmonary artery is not enlarged. No pulmonary arterial filling defects to the level of the subsegmental branches. Heart size is normal, no right heart strain. No pericardial effusion. Thoracic aorta is normal course and caliber, mild intimal thickening and calcific atherosclerosis. MEDIASTINUM/NODES: No lymphadenopathy by CT size criteria. 10 mm short access RIGHT peritracheal lymph node, nonspecific. LUNGS/PLEURA: Tracheobronchial tree is patent, no pneumothorax. Mild  bronchial wall thickening. No pleural effusions, focal consolidations, pulmonary nodules or masses. UPPER ABDOMEN: Mildly hypodense liver seen with steatosis. No acute process in the upper abdomen. Small hiatal hernia. MUSCULOSKELETAL: Visualized soft tissues and included osseous structures appear normal. Review of the MIP images confirms the above findings. IMPRESSION: Item 1. No acute pulmonary embolism. 2. Bronchial wall thickening seen with bronchitis and reactive airway disease. No focal consolidation. Aortic Atherosclerosis (ICD10-I70.0). Electronically Signed   By: Elon Alas M.D.   On: 12/27/2016 04:50   Dg Chest Port 1 View  Result Date: 12/27/2016 CLINICAL DATA:  Pneumonia. Shortness of breath. History of hypertension. EXAM: PORTABLE CHEST 1 VIEW COMPARISON:  Chest radiograph December 16, 2016 FINDINGS: The heart size and mediastinal contours are within normal limits. Both lungs are clear. Minimal LEFT lung base scarring. The visualized skeletal structures are unremarkable. IMPRESSION: No acute cardiopulmonary process. Electronically Signed   By: Elon Alas M.D.   On: 12/27/2016 03:35    EKG: Independently reviewed.  Assessment/Plan Principal Problem:   Respiratory distress Active Problems:   Bronchitis   Reactive airway disease   Diastolic dysfunction   Essential hypertension   1.   Acute respiratory failure with hypoxia   patient is doing little bit better in fact he has been weaned off of oxygen at this time.  He has responded well to current therapy. Radiologically does not appear to have any kind of pneumonitis at this time.  This may represent reactive airways disease or asthmatic bronchitis.  2. Possible asthmatic bronchitis   Currently agree with the IV steroids as well as nebulizers and antibiotics He will need follow-up in the office so that we can perform pulmonary function test to better assess his airways. On discharge she should probably go home with  inhaled corticosteroid Laba combination Rescue inhaler in the form Pro air should be prescribed. Would also do ambulatory pulse oximetry to determine if there is any desaturations with ambulation prior to discharge    Code status full code  Family communication no one in the room  Disposition home   Time spent:  70 min    I have personally obtained a history, examined the patient, evaluated laboratory and imaging results, formulated the assessment and plan and placed orders.  The Patient requires high complexity decision making for assessment and support.    Allyne Gee, MD [facility] Pulmonary Critical Care Medicine Sleep Medicine

## 2016-12-27 NOTE — Consult Note (Signed)
Cardiology Consultation Note  Patient ID: Brian Huffman., MRN: [id number], DOB/AGE: 09/23/1945 71 y.o. Admit date: 12/27/2016   Date of Consult: 12/27/2016 Primary Physician: Elaina Pattee, MD Primary Cardiologist: New to [facility] - consult by End Requesting Physician: Dr. Marcille Blanco, MD  Chief Complaint: SOB Reason for Consult: SOB  HPI: Brian Huffman. is a 71 y.o. male who is being seen today for the evaluation of SOB at the request of Dr. Marcille Blanco, MD. Patient has a h/o HTN, HLD, hypothyroidism, vertigo, obesity, and LBBB since at least 10/2016, who presented to [facility] with continued cough and mild SOB and was found to have acute bronchitis and reactive airway disease.   No prior known cardiac history. Patient was recently admitted in late July for CAP, treated with ABX per IM. He returned to the ED in late August with continued cough and mild SOB. Workup was unrevealing and he was discharged with outpatient follow up. He continued to note significant coughing, occasionally of white sputum, with mild SOB. No fever or chills. Discharge weight from late July of 243 pounds with presenting weight on 9/10 of 242 pounds. Weight has been stable in the 242 to 243 pounds. Never with chest pain. Stable orthopnea for several years. No early satiety or abdominal distension. Stable lower extremity swelling 2/2 reported venous insufficiency.    Upon his arrival to [facility] he was noted to have BP 183/104, HR 92 bpm, temperature 98.6, oxygen saturation 98% on room air, weight 242 pounds. CTA chest was negative for PE though did show bronchitis and reactive airway disease. CXR negative. BNP normal. Echo pending. Troponin negative to date. EKG as below. WBC 6,4 HGB 15.9, PLT 139, SCR0.92, K+ 4.0. He has received albuterol nebulizers, 40 mg of IV Lasix, and nitro paste has been applied. Currently,     Past Medical History:  Diagnosis Date  . HLD (hyperlipidemia)   . Hypertension   . Hypothyroidism     . Obesity   . Trigeminal neuralgia   . Vertigo       Most Recent Cardiac Studies: TTE pending   Surgical History: History reviewed. No pertinent surgical history.   Home Meds: Prior to Admission medications   Medication Sig Start Date End Date Taking? Authorizing Provider  albuterol (PROVENTIL HFA;VENTOLIN HFA) 108 (90 Base) MCG/ACT inhaler Inhale 2 puffs into the lungs every 6 (six) hours as needed for wheezing or shortness of breath. 11/06/16  Yes Epifanio Lesches, MD  allopurinol (ZYLOPRIM) 100 MG tablet Take 1 tablet by mouth daily. 09/18/16  Yes [provider]  amLODipine (NORVASC) 5 MG tablet Take 5 mg by mouth daily.   Yes [provider]  atorvastatin (LIPITOR) 10 MG tablet Take 10 mg by mouth daily.   Yes [provider]  azelastine (ASTELIN) 0.1 % nasal spray Place 2 sprays into both nostrils 2 (two) times daily. Use in each nostril as directed   Yes [provider]  benzonatate (TESSALON) 100 MG capsule Take 100 mg by mouth 3 (three) times daily as needed for cough.   Yes [provider]  candesartan (ATACAND) 8 MG tablet Take 1 tablet by mouth 2 (two) times daily. 10/27/16  Yes [provider]  carbamazepine (TEGRETOL XR) 100 MG 12 hr tablet Take 100 mg by mouth 4 (four) times daily.   Yes [provider]  cetirizine (ZYRTEC) 10 MG tablet Take 10 mg by mouth daily.   Yes [provider]  ezetimibe (ZETIA) 10 MG  tablet Take 5 mg by mouth daily.   Yes [provider]  fluticasone (FLONASE) 50 MCG/ACT nasal spray Place 2 sprays into both nostrils daily.   Yes [provider]  gabapentin (NEURONTIN) 100 MG capsule Take 100 mg by mouth 3 (three) times daily.   Yes [provider]  levofloxacin (LEVAQUIN) 500 MG tablet Take 500 mg by mouth daily. 12/21/16 12/31/16 Yes [provider]  levothyroxine (SYNTHROID, LEVOTHROID) 175 MCG tablet Take 175 mcg by mouth daily before  breakfast.   Yes [provider]  memantine (NAMENDA) 5 MG tablet Take 5 mg by mouth 2 (two) times daily.   Yes [provider]  promethazine (PHENERGAN) 25 MG tablet Take 25 mg by mouth every 6 (six) hours as needed for nausea or vomiting.   Yes [provider]  ranitidine (ZANTAC) 300 MG tablet Take 300 mg by mouth at bedtime.   Yes [provider]  tamsulosin (FLOMAX) 0.4 MG CAPS capsule Take 0.4 mg by mouth daily.   Yes [provider]  traZODone (DESYREL) 50 MG tablet Take 50 mg by mouth at bedtime.   Yes [provider]  predniSONE (STERAPRED UNI-PAK 21 TAB) 10 MG (21) TBPK tablet Taper by 10 mg daily Patient not taking: Reported on 12/27/2016 11/06/16   Epifanio Lesches, MD    Inpatient Medications:  . allopurinol  100 mg Oral Daily  . amLODipine  5 mg Oral Daily  . atorvastatin  10 mg Oral Daily  . azelastine  2 spray Each Nare BID  . carbamazepine  100 mg Oral QID  . docusate sodium  100 mg Oral BID  . enoxaparin (LOVENOX) injection  40 mg Subcutaneous Q24H  . ezetimibe  5 mg Oral Daily  . famotidine  40 mg Oral Daily  . fluticasone  2 spray Each Nare Daily  . gabapentin  100 mg Oral TID  . ipratropium-albuterol  3 mL Nebulization Q6H  . irbesartan  75 mg Oral Daily  . levothyroxine  175 mcg Oral QAC breakfast  . loratadine  10 mg Oral Daily  . memantine  5 mg Oral BID  . tamsulosin  0.4 mg Oral Daily  . tiotropium  18 mcg Inhalation Daily  . traZODone  50 mg Oral QHS     Allergies:  Allergies  Allergen Reactions  . [place] Grass Extract Other (See Comments)    Difficulty breathing  . Other Other (See Comments)    DUST, sneezing, watery eyes  . Statins Other (See Comments)    Atorvastatin, simvastatin and pravastatin    . Penicillins Rash  . Shellfish-Derived Products Rash    Social History   Social History  . Marital status: Married    Spouse name: N/A  . Number of children: N/A  . Years of  education: N/A   Occupational History  . Not on file.   Social History Main Topics  . Smoking status: Never Smoker  . Smokeless tobacco: Never Used  . Alcohol use No  . Drug use: No  . Sexual activity: Not on file   Other Topics Concern  . Not on file   Social History Narrative  . No narrative on file     Family History  Problem Relation Age of Onset  . Stroke Mother   . Diabetes Mellitus II Father   . Diabetes Mellitus II Sister      Review of Systems: Review of Systems  Constitutional: Positive for malaise/fatigue. Negative for chills, diaphoresis, fever and  weight loss.  HENT: Negative for congestion.   Eyes: Negative for discharge and redness.  Respiratory: Positive for cough, sputum production and shortness of breath. Negative for hemoptysis and wheezing.        Mild SOB. White sputum.   Cardiovascular: Negative for chest pain, palpitations, orthopnea, claudication, leg swelling and PND.  Gastrointestinal: Negative for abdominal pain, blood in stool, heartburn, melena, nausea and vomiting.  Genitourinary: Negative for hematuria.  Musculoskeletal: Negative for falls and myalgias.  Skin: Negative for rash.  Neurological: Negative for dizziness, tingling, tremors, sensory change, speech change, focal weakness, loss of consciousness and weakness.  Endo/Heme/Allergies: Does not bruise/bleed easily.  Psychiatric/Behavioral: Negative for substance abuse. The patient is not nervous/anxious.   All other systems reviewed and are negative.   Labs:  Recent Labs  12/27/16 0251  TROPONINI <0.03   Lab Results  Component Value Date   WBC 6.4 12/27/2016   HGB 15.9 12/27/2016   HCT 44.6 12/27/2016   MCV 91.7 12/27/2016   PLT 139 (L) 12/27/2016     Recent Labs Lab 12/27/16 0251  NA 131*  K 4.0  CL 99*  CO2 22  BUN 13  CREATININE 0.92  CALCIUM 9.3  GLUCOSE 122*   No results found for: CHOL, HDL, LDLCALC, TRIG No results found for: DDIMER  Radiology/Studies:   Dg Chest 2 View  Result Date: 12/16/2016 CLINICAL DATA:  Recent pneumonia 4 weeks ago. EXAM: CHEST  2 VIEW COMPARISON:  11/04/2016 FINDINGS: The heart size and mediastinal contours are within normal limits. Both lungs are clear. The visualized skeletal structures are unremarkable. IMPRESSION: No active cardiopulmonary disease. Electronically Signed   By: Kathreen Devoid   On: 12/16/2016 13:21   Ct Angio Chest Pe W And/or Wo Contrast  Result Date: 12/27/2016 IMPRESSION: Item 1. No acute pulmonary embolism. 2. Bronchial wall thickening seen with bronchitis and reactive airway disease. No focal consolidation. Aortic Atherosclerosis (ICD10-I70.0). Electronically Signed   By: Elon Alas M.D.   On: 12/27/2016 04:50   Dg Chest Port 1 View  Result Date: 12/27/2016 IMPRESSION: No acute cardiopulmonary process. Electronically Signed   By: Elon Alas M.D.   On: 12/27/2016 03:35    EKG: Interpreted by me showed: NSR, 94 bpm, LBBB (known since at least 10/2016) Telemetry: Interpreted by me showed: sinus tachycardia, low 100s bpm, LBBB  Weights: [employer]   12/27/16 0246  Weight: 242 lb (109.8 kg)     Physical Exam: Blood pressure 137/76, pulse 81, temperature 98.6 F (37 C), temperature source Oral, resp. rate 13, height 5\' 4"  (1.626 m), weight 242 lb (109.8 kg), SpO2 97 %. Body mass index is 41.54 kg/m. General: Well developed, well nourished, in no acute distress. Head: Normocephalic, atraumatic, sclera non-icteric, no xanthomas, nares are without discharge.  Neck: Negative for carotid bruits. JVD not elevated. Lungs: Clear bilaterally to auscultation without wheezes, rales, or rhonchi. Breathing is unlabored. Heart: RRR with S1 S2. I/VI systolic murmur, no rubs, or gallops appreciated. Abdomen: Soft, non-tender, non-distended with normoactive bowel sounds. No hepatomegaly. No rebound/guarding. No obvious abdominal masses. Msk:  Strength and tone appear normal for  age. Extremities: No clubbing or cyanosis. Trace pre-tibial edema bilaterally. Distal pedal pulses are 2+ and equal bilaterally. Neuro: Alert and oriented X 3. No facial asymmetry. No focal deficit. Moves all extremities spontaneously. Psych:  Responds to questions appropriately with a normal affect.    Assessment and Plan:  Principal Problem:   Respiratory distress Active Problems:   Bronchitis  Reactive airway disease   Diastolic dysfunction   Essential hypertension    1. Acute respiratory distress: -Likely pulmonary in etiology with documented bronchitis and reactive airway disease on CTA chest with possible component of diastolic dysfunction -Cannot rule out some component of underlying pulmonary issue stemming from his service in the [employer], served in [place]  -From a cardiac perspective, reassuring CXR and BNP -Unlikely that a cough by itself without chest pain, SOB, or swelling is cardiac in etiology -His mild cardiac murmur is unlikely to be the etiology of his cough in the setting of the above -Cough has improved with albuterol  -TTE pending per IM, if normal, no further cardiac workup is advised -Recommend IM address patient's underlying pulmonary status -Stop nitro paste  2. Diastolic dysfunction: -TTE pending -Received IV Lasix 40 mg x 1 in the ED -He does not appear grossly volume overloaded   3. HTN: -Improved -Per IM  4. Bronchitis/reactive airway disease: -Consider steroids, defer to IM -Per IM  5. LBBB: -Known since at least 10/2016 -Outpatient follow up for likely stress testing  6. Obesity: -Weight loss advised -Weight and deconditioning likely playing a role in some of his SOB  7. Sinus tachycardia: -Likely in the setting of underlying bronchitis and albuterol usage   8. Murmur: -TTE pending    Signed, Marcille Blanco [place] Pager: [phone] 12/27/2016, 8:46 AM

## 2016-12-27 NOTE — Progress Notes (Signed)
[facility] [place] NAME: Brian Huffman    MR#:  [id number]  DATE OF BIRTH:  1945-11-16  SUBJECTIVE:  CHIEF COMPLAINT:  Patient is feeling tight in his chest and cough  REVIEW OF SYSTEMS:  CONSTITUTIONAL: No fever, fatigue or weakness.  EYES: No blurred or double vision.  EARS, NOSE, AND THROAT: No tinnitus or ear pain.  RESPIRATORY: Reporting cough and chest tightness denies any hemoptysis  CARDIOVASCULAR: No chest pain, orthopnea, edema.  GASTROINTESTINAL: No nausea, vomiting, diarrhea or abdominal pain.  GENITOURINARY: No dysuria, hematuria.  ENDOCRINE: No polyuria, nocturia,  HEMATOLOGY: No anemia, easy bruising or bleeding SKIN: No rash or lesion. MUSCULOSKELETAL: No joint pain or arthritis.   NEUROLOGIC: No tingling, numbness, weakness.  PSYCHIATRY: No anxiety or depression.   DRUG ALLERGIES:   Allergies  Allergen Reactions  . [place] Grass Extract Other (See Comments)    Difficulty breathing  . Other Other (See Comments)    DUST, sneezing, watery eyes  . Statins Other (See Comments)    Atorvastatin, simvastatin and pravastatin    . Penicillins Rash  . Shellfish-Derived Products Rash    VITALS:  Blood pressure 137/76, pulse 81, temperature 98.6 F (37 C), temperature source Oral, resp. rate 13, height 5\' 4"  (1.626 m), weight 109.8 kg (242 lb), SpO2 97 %.  PHYSICAL EXAMINATION:  GENERAL:  71 y.o.-year-old patient lying in the bed with no acute distress.  EYES: Pupils equal, round, reactive to light and accommodation. No scleral icterus. Extraocular muscles intact.  HEENT: Head atraumatic, normocephalic. Oropharynx and nasopharynx clear.  NECK:  Supple, no jugular venous distention. No thyroid enlargement, no tenderness.  LUNGS: Moderate coarse breath sounds bilaterally, no wheezing, rales,rhonchi or crepitation. No use of accessory muscles of respiration.  CARDIOVASCULAR: S1, S2 normal. No murmurs, rubs, or  gallops.  ABDOMEN: Soft, nontender, nondistended. Bowel sounds present. No organomegaly or mass.  EXTREMITIES: No pedal edema, cyanosis, or clubbing.  NEUROLOGIC: Cranial nerves II through XII are intact. Muscle strength 5/5 in all extremities. Sensation intact. Gait not checked.  PSYCHIATRIC: The patient is alert and oriented x 3.  SKIN: No obvious rash, lesion, or ulcer.    LABORATORY PANEL:   CBC  Recent Labs Lab 12/27/16 0251  WBC 6.4  HGB 15.9  HCT 44.6  PLT 139*   ------------------------------------------------------------------------------------------------------------------  Chemistries   Recent Labs Lab 12/27/16 0251  NA 131*  K 4.0  CL 99*  CO2 22  GLUCOSE 122*  BUN 13  CREATININE 0.92  CALCIUM 9.3   ------------------------------------------------------------------------------------------------------------------  Cardiac Enzymes  Recent Labs Lab 12/27/16 0251  TROPONINI <0.03   ------------------------------------------------------------------------------------------------------------------  RADIOLOGY:  Ct Angio Chest Pe W And/or Wo Contrast  Result Date: 12/27/2016 CLINICAL DATA:  Shortness of breath, on Levaquin for pneumonia. EXAM: CT ANGIOGRAPHY CHEST WITH CONTRAST TECHNIQUE: Multidetector CT imaging of the chest was performed using the standard protocol during bolus administration of intravenous contrast. Multiplanar CT image reconstructions and MIPs were obtained to evaluate the vascular anatomy. CONTRAST:  75 cc Isovue 370 COMPARISON:  Chest radiograph December 27, 2016 at 0248 hours FINDINGS: CARDIOVASCULAR: Adequate contrast opacification of the pulmonary artery's. Main pulmonary artery is not enlarged. No pulmonary arterial filling defects to the level of the subsegmental branches. Heart size is normal, no right heart strain. No pericardial effusion. Thoracic aorta is normal course and caliber, mild intimal thickening and calcific  atherosclerosis. MEDIASTINUM/NODES: No lymphadenopathy by CT size criteria. 10 mm short access RIGHT peritracheal lymph  node, nonspecific. LUNGS/PLEURA: Tracheobronchial tree is patent, no pneumothorax. Mild bronchial wall thickening. No pleural effusions, focal consolidations, pulmonary nodules or masses. UPPER ABDOMEN: Mildly hypodense liver seen with steatosis. No acute process in the upper abdomen. Small hiatal hernia. MUSCULOSKELETAL: Visualized soft tissues and included osseous structures appear normal. Review of the MIP images confirms the above findings. IMPRESSION: Item 1. No acute pulmonary embolism. 2. Bronchial wall thickening seen with bronchitis and reactive airway disease. No focal consolidation. Aortic Atherosclerosis (ICD10-I70.0). Electronically Signed   By: Elon Alas M.D.   On: 12/27/2016 04:50   Dg Chest Port 1 View  Result Date: 12/27/2016 CLINICAL DATA:  Pneumonia. Shortness of breath. History of hypertension. EXAM: PORTABLE CHEST 1 VIEW COMPARISON:  Chest radiograph December 16, 2016 FINDINGS: The heart size and mediastinal contours are within normal limits. Both lungs are clear. Minimal LEFT lung base scarring. The visualized skeletal structures are unremarkable. IMPRESSION: No acute cardiopulmonary process. Electronically Signed   By: Elon Alas M.D.   On: 12/27/2016 03:35    EKG:   Orders placed or performed during the hospital encounter of 12/27/16  . ED EKG  . ED EKG    ASSESSMENT AND PLAN:    71 year old male admitted for acute respiratory distress  #Acute respiratory distress secondary to acute bronchitis Start IV steroids, nebulizer treatments, antibiotics Await clinical improvement Patient never smoked in his life  #Hyponatremia Sodium at 131 provide gentle hydration with IV fluids and recheck in a.m.  #Essential hypertension Blood pressure is stable continue amlodipine and ARB  #Hypothyroidism continue Synthroid   #gout continue  allopurinol  #BPH continue tamsulosin      DVT prophylaxis: Lovenox  GI prophylaxis: None     All the records are reviewed and case discussed with Care Management/Social Workerr. Management plans discussed with the patient, family and they are in agreement.  CODE STATUS: fc   TOTAL TIME TAKING CARE OF THIS PATIENT: 34 minutes.   POSSIBLE D/C IN 2  DAYS, DEPENDING ON CLINICAL CONDITION.  Note: This dictation was prepared with Dragon dictation along with smaller phrase technology. Any transcriptional errors that result from this process are unintentional.   Nicholes Mango M.D on 12/27/2016 at 2:34 PM  Between 7am to 6pm - Pager - [phone] After 6pm go to [email or web address] - password [email or web address] [facility]  [facility]  Office  [phone]  CC: Primary care physician; Elaina Pattee, MD

## 2016-12-27 NOTE — H&P (Signed)
[name] is an 70 y.o. male.   Chief Complaint: shortness of breath HPI: the patient with past medical history of hypertension presents to the emergency department complaining of shortness of breath. the patient had been seen in the hospital for pneumonia 2 weeks ago but has not significantly decreased coughing since that time. He has paroxysms of cough that make him feel as if "his throat may close up". His cough is productive of white sputum that is occasionally frothy but other times of thick. In the emergency department the patient required supplemental oxygen and had increased work of breathing. Respiratory rate was as high as 35 at the bedside.chest x-ray was negative and CT of the chest did not show any infiltrate. After some time the patient's respiratory rate improved but he still required supplemental oxygen which prompted emergency department staff to call hospitalist service for admission.  Past Medical History:  Diagnosis Date  . Hypertension   . Trigeminal neuralgia     No past surgical history on file. None  Family History  Problem Relation Age of Onset  . Stroke Mother   . Diabetes Mellitus II Father   . Diabetes Mellitus II Sister    Social History:  reports that he has never smoked. He has never used smokeless tobacco. He reports that he does not drink alcohol or use drugs.  Allergies:  Allergies  Allergen Reactions  . [place] Grass Extract Other (See Comments)    Difficulty breathing  . Other Other (See Comments)    DUST, sneezing, watery eyes  . Statins Other (See Comments)    Atorvastatin, simvastatin and pravastatin    . Penicillins Rash  . Shellfish-Derived Products Rash     (Not in a hospital admission)  Results for orders placed or performed during the hospital encounter of 12/27/16 (from the past 48 hour(s))  Basic metabolic panel     Status: Abnormal   Collection Time: 12/27/16  2:51 AM  Result Value Ref Range   Sodium 131 (L) 135 -  145 mmol/L   Potassium 4.0 3.5 - 5.1 mmol/L   Chloride 99 (L) 101 - 111 mmol/L   CO2 22 22 - 32 mmol/L   Glucose, Bld 122 (H) 65 - 99 mg/dL   BUN 13 6 - 20 mg/dL   Creatinine, Ser 9.79 0.61 - 1.24 mg/dL   Calcium 9.3 8.9 - 15.9 mg/dL   GFR calc non Af Amer >60 >60 mL/min   GFR calc Af Amer >60 >60 mL/min    Comment: (NOTE) The eGFR has been calculated using the CKD EPI equation. This calculation has not been validated in all clinical situations. eGFR's persistently <60 mL/min signify possible Chronic Kidney Disease.    Anion gap 10 5 - 15  CBC     Status: Abnormal   Collection Time: 12/27/16  2:51 AM  Result Value Ref Range   WBC 6.4 3.8 - 10.6 K/uL   RBC 4.86 4.40 - 5.90 MIL/uL   Hemoglobin 15.9 13.0 - 18.0 g/dL   HCT 71.4 83.9 - 12.6 %   MCV 91.7 80.0 - 100.0 fL   MCH 32.7 26.0 - 34.0 pg   MCHC 35.7 32.0 - 36.0 g/dL   RDW 21.3 20.0 - 05.0 %   Platelets 139 (L) 150 - 440 K/uL  Troponin I     Status: None   Collection Time: 12/27/16  2:51 AM  Result Value Ref Range   Troponin I <0.03 <0.03 ng/mL  Brain natriuretic peptide  Status: None   Collection Time: 12/27/16  2:51 AM  Result Value Ref Range   B Natriuretic Peptide 27.0 0.0 - 100.0 pg/mL   Ct Angio Chest Pe W And/or Wo Contrast  Result Date: 12/27/2016 CLINICAL DATA:  Shortness of breath, on Levaquin for pneumonia. EXAM: CT ANGIOGRAPHY CHEST WITH CONTRAST TECHNIQUE: Multidetector CT imaging of the chest was performed using the standard protocol during bolus administration of intravenous contrast. Multiplanar CT image reconstructions and MIPs were obtained to evaluate the vascular anatomy. CONTRAST:  75 cc Isovue 370 COMPARISON:  Chest radiograph December 27, 2016 at 0248 hours FINDINGS: CARDIOVASCULAR: Adequate contrast opacification of the pulmonary artery's. Main pulmonary artery is not enlarged. No pulmonary arterial filling defects to the level of the subsegmental branches. Heart size is normal, no right heart  strain. No pericardial effusion. Thoracic aorta is normal course and caliber, mild intimal thickening and calcific atherosclerosis. MEDIASTINUM/NODES: No lymphadenopathy by CT size criteria. 10 mm short access RIGHT peritracheal lymph node, nonspecific. LUNGS/PLEURA: Tracheobronchial tree is patent, no pneumothorax. Mild bronchial wall thickening. No pleural effusions, focal consolidations, pulmonary nodules or masses. UPPER ABDOMEN: Mildly hypodense liver seen with steatosis. No acute process in the upper abdomen. Small hiatal hernia. MUSCULOSKELETAL: Visualized soft tissues and included osseous structures appear normal. Review of the MIP images confirms the above findings. IMPRESSION: Item 1. No acute pulmonary embolism. 2. Bronchial wall thickening seen with bronchitis and reactive airway disease. No focal consolidation. Aortic Atherosclerosis (ICD10-I70.0). Electronically Signed   By: Elon Alas M.D.   On: 12/27/2016 04:50   Dg Chest Port 1 View  Result Date: 12/27/2016 CLINICAL DATA:  Pneumonia. Shortness of breath. History of hypertension. EXAM: PORTABLE CHEST 1 VIEW COMPARISON:  Chest radiograph December 16, 2016 FINDINGS: The heart size and mediastinal contours are within normal limits. Both lungs are clear. Minimal LEFT lung base scarring. The visualized skeletal structures are unremarkable. IMPRESSION: No acute cardiopulmonary process. Electronically Signed   By: Elon Alas M.D.   On: 12/27/2016 03:35    Review of Systems  Constitutional: Negative for chills and fever.  HENT: Negative for sore throat and tinnitus.   Eyes: Negative for blurred vision and redness.  Respiratory: Positive for cough and shortness of breath.   Cardiovascular: Negative for chest pain, palpitations, orthopnea and PND.  Gastrointestinal: Negative for abdominal pain, diarrhea, nausea and vomiting.  Genitourinary: Negative for dysuria, frequency and urgency.  Musculoskeletal: Negative for joint pain and  myalgias.  Skin: Negative for rash.       No lesions  Neurological: Negative for speech change, focal weakness and weakness.  Endo/Heme/Allergies: Does not bruise/bleed easily.       No temperature intolerance  Psychiatric/Behavioral: Negative for depression and suicidal ideas.    Blood pressure 120/63, pulse 84, temperature 98.6 F (37 C), temperature source Oral, resp. rate 20, height _0  (1.626 m), weight 109.8 kg (242 lb), SpO2 93 %. Physical Exam  Constitutional: He is oriented to person, place, and time. He appears well-developed and well-nourished. No distress.  HENT:  Head: Normocephalic and atraumatic.  Mouth/Throat: Oropharynx is clear and moist.  Eyes: Pupils are equal, round, and reactive to light. Conjunctivae and EOM are normal.  Neck: Normal range of motion. Neck supple. No JVD present. No tracheal deviation present. No thyromegaly present.  Cardiovascular: Normal rate, regular rhythm and normal heart sounds.  Exam reveals no gallop and no friction rub.   No murmur heard. Respiratory: Tachypnea noted. He has wheezes.  GI: Soft. Bowel sounds  are normal. He exhibits no distension. There is no tenderness.  Genitourinary:  Genitourinary Comments: Deferred  Musculoskeletal: Normal range of motion. He exhibits edema (pretibial).  Lymphadenopathy:    He has no cervical adenopathy.  Neurological: He is alert and oriented to person, place, and time. No cranial nerve deficit.  Skin: Skin is warm and dry. No rash noted. He is not diaphoretic. No erythema.  Psychiatric: He has a normal mood and affect. His behavior is normal. Judgment and thought content normal.     Assessment/Plan This is a 71 year old male admitted for respiratory distress. 1. Respiratory distress: Acute with hypoxia.lungs are clear but have the appearance of COPD. The patient is a nonsmoker and has not been exposed to secondhand smoke.nonetheless I have placed him on Spiriva. He has a persistent wheeze  within the right lower lung field. He patient also has some lower extremity edema which could indicate congestive heart failure. However BNP is not elevated.  He may still have some diastolic dysfunction. Thus I ordered an echocardiogram as well. Albuterol as needed. (also discontinue Astelin spray as the patient may have some rebound rhinorrhea and postnasal drip).  2. Hyponatremia:differential diagnosis includes hypervolemia as well as chronic lung problems. 3. Hypertension: initially uncontrolled; continue ARB and amlodipine 4. Hypothyroidism: Continue Synthroid 5. Hyperlipidemia: Continue statin therapy as well as seborrhea 6. Gout: Continue allopurinol  7. BPH: Continue tamsulosin 8. Mild cognitive dysfunction: Continue Namenda 9. DVT prophylaxis: Lovenox 10. GI prophylaxis: None The patient is a full code. Time spent on admission orders and patient care approximately 45 minutes  Harrie Foreman, MD 12/27/2016, 6:16 AM

## 2016-12-27 NOTE — ED Notes (Signed)
Pt recently dx'd w/ pneumonia, is taking second course of Levaquin starting 4 days ago.

## 2016-12-27 NOTE — Progress Notes (Signed)
lovenox increased to 40mg  BID due to a crcl >17ml/min and BMI >40  Brian Huffman D Brian Huffman, Pharm.D, BCPS Clinical Pharmacist

## 2016-12-28 DIAGNOSIS — R0603 Acute respiratory distress: Secondary | ICD-10-CM | POA: Diagnosis not present

## 2016-12-28 DIAGNOSIS — I10 Essential (primary) hypertension: Secondary | ICD-10-CM | POA: Diagnosis not present

## 2016-12-28 DIAGNOSIS — J209 Acute bronchitis, unspecified: Secondary | ICD-10-CM | POA: Diagnosis not present

## 2016-12-28 DIAGNOSIS — E871 Hypo-osmolality and hyponatremia: Secondary | ICD-10-CM | POA: Diagnosis not present

## 2016-12-28 LAB — BASIC METABOLIC PANEL
Anion gap: 9 (ref 5–15)
BUN: 20 mg/dL (ref 6–20)
CALCIUM: 9 mg/dL (ref 8.9–10.3)
CO2: 23 mmol/L (ref 22–32)
CREATININE: 0.92 mg/dL (ref 0.61–1.24)
Chloride: 97 mmol/L — ABNORMAL LOW (ref 101–111)
GFR calc Af Amer: >60 mL/min (ref >60)
GFR calc non Af Amer: >60 mL/min (ref >60)
GLUCOSE: 126 mg/dL — AB (ref 65–99)
Potassium: 4.3 mmol/L (ref 3.5–5.1)
Sodium: 129 mmol/L — ABNORMAL LOW (ref 135–145)

## 2016-12-28 LAB — ECHOCARDIOGRAM COMPLETE
Height: 64 in
Weight: 3872 oz

## 2016-12-28 MED ORDER — GUAIFENESIN-CODEINE 100-10 MG/5ML PO SOLN: 10.0000 mL | Freq: Three times a day (TID) | ORAL | 0 refills | Status: DC | PRN

## 2016-12-28 MED ORDER — AZITHROMYCIN 500 MG PO TABS: 500.0000 mg | ORAL_TABLET | Freq: Every day | ORAL | 0 refills | Status: AC

## 2016-12-28 MED ORDER — CEFUROXIME AXETIL 250 MG PO TABS: 250.0000 mg | ORAL_TABLET | Freq: Two times a day (BID) | ORAL | 0 refills | Status: AC

## 2016-12-28 MED ORDER — PREDNISONE 10 MG (21) PO TBPK: ORAL_TABLET | ORAL | 0 refills | Status: DC

## 2016-12-28 MED ORDER — TIOTROPIUM BROMIDE MONOHYDRATE 18 MCG IN CAPS: 18.0000 ug | ORAL_CAPSULE | Freq: Every day | RESPIRATORY_TRACT | 1 refills | Status: AC

## 2016-12-28 MED ORDER — SODIUM CHLORIDE 0.9 % IV SOLN
INTRAVENOUS | Status: DC
Start: 2016-12-28 — End: 2016-12-28
  Administered 2016-12-28: 09:00:00 via INTRAVENOUS

## 2016-12-28 MED ORDER — LEVOTHYROXINE SODIUM 175 MCG PO TABS
175.0000 ug | ORAL_TABLET | Freq: Every day | ORAL | Status: DC
  Administered 2016-12-28: 175 ug via ORAL
  Filled 2016-12-28: qty 1

## 2016-12-28 MED ORDER — IPRATROPIUM-ALBUTEROL 0.5-2.5 (3) MG/3ML IN SOLN: 3.0000 mL | Freq: Four times a day (QID) | RESPIRATORY_TRACT | Status: DC | PRN

## 2016-12-28 NOTE — Progress Notes (Signed)
    Cough from likely asthmatic bronchitis. Echo pending. If normal EF and without significant valvular abnormalities, no further inpatient cardiac workup is advised.

## 2016-12-28 NOTE — Progress Notes (Signed)
Patient discharged home as ordered,instructions explained and well understood,prescriptions given to patient,vital signs within normal limits,escorted by staff member via wheel chair.

## 2016-12-28 NOTE — Care Management Obs Status (Signed)
[facility] NOTIFICATION   Patient Details  Name: Brian Huffman. MRN: [id number] Date of Birth: Jun 01, 1945   Medicare Observation Status Notification Given:  Yes    Shelbie Ammons, RN 12/28/2016, 9:32 AM

## 2016-12-28 NOTE — Discharge Summary (Signed)
[facility] [place] NAME: Marinus Eicher    MR#:  [id number]  DATE OF BIRTH:  [birth year]  DATE OF ADMISSION:  12/27/2016 ADMITTING PHYSICIAN: Harrie Foreman, MD  DATE OF DISCHARGE: 12/28/2016   PRIMARY CARE PHYSICIAN: Elaina Pattee, MD    ADMISSION DIAGNOSIS:  Respiratory distress [R06.03]  DISCHARGE DIAGNOSIS:  Principal Problem:   Respiratory distress Active Problems:   Bronchitis   Reactive airway disease   Diastolic dysfunction   Essential hypertension     SECONDARY DIAGNOSIS:   Past Medical History:  Diagnosis Date  . HLD (hyperlipidemia)   . Hypertension   . Hypothyroidism   . Obesity   . Trigeminal neuralgia   . Vertigo     HOSPITAL COURSE:   71 year old male admitted for acute respiratory distress  #Acute respiratory distress secondary to acute bronchitis Start IV steroids, nebulizer treatments, antibiotics Have good clinical improvement Patient never smoked in his life  not much wheezing.  will give 7 days oral Abx on d/c.  Advised to follow with PMD in 1 week to check resolution. Appreciated help By cardio- Echo done.  #Hyponatremia Sodium at 131 provide gentle hydration with IV fluids - likely due to bronchitis, Need to follow with PMD in 1 week.  #Essential hypertension Blood pressure is stable continue amlodipine and ARB  #Hypothyroidism continue Synthroid   #gout continue allopurinol  #BPH continue tamsulosin   DISCHARGE CONDITIONS:   Stable.  CONSULTS OBTAINED:  Treatment Team:  Allyne Gee, MD End, Harrell Gave, MD  DRUG ALLERGIES:   Allergies  Allergen Reactions  . [place] Grass Extract Other (See Comments)    Difficulty breathing  . Other Other (See Comments)    DUST, sneezing, watery eyes  . Statins Other (See Comments)    Atorvastatin, simvastatin and pravastatin    . Penicillins Rash  . Shellfish-Derived Products Rash    DISCHARGE MEDICATIONS:    Current Discharge Medication List    START taking these medications   Details  azithromycin (ZITHROMAX) 500 MG tablet Take 1 tablet (500 mg total) by mouth daily. Take 1 tablet daily for 3 days. Qty: 3 tablet, Refills: 0    cefUROXime (CEFTIN) 250 MG tablet Take 1 tablet (250 mg total) by mouth 2 (two) times daily. Qty: 20 tablet, Refills: 0    guaiFENesin-codeine 100-10 MG/5ML syrup Take 10 mLs by mouth 3 (three) times daily as needed for cough. Qty: 120 mL, Refills: 0    tiotropium (SPIRIVA) 18 MCG inhalation capsule Place 1 capsule (18 mcg total) into inhaler and inhale daily. Qty: 30 capsule, Refills: 1      CONTINUE these medications which have CHANGED   Details  predniSONE (STERAPRED UNI-PAK 21 TAB) 10 MG (21) TBPK tablet Take 6 tabs first day, 5 tab on day 2, then 4 on day 3rd, 3 tabs on day 4th , 2 tab on day 5th, and 1 tab on 6th day. Qty: 21 tablet, Refills: 0      CONTINUE these medications which have NOT CHANGED   Details  albuterol (PROVENTIL HFA;VENTOLIN HFA) 108 (90 Base) MCG/ACT inhaler Inhale 2 puffs into the lungs every 6 (six) hours as needed for wheezing or shortness of breath. Qty: 1 Inhaler, Refills: 2    allopurinol (ZYLOPRIM) 100 MG tablet Take 1 tablet by mouth daily.    amLODipine (NORVASC) 5 MG tablet Take 5 mg by mouth daily.    atorvastatin (LIPITOR) 10 MG tablet Take 10 mg  by mouth daily.    azelastine (ASTELIN) 0.1 % nasal spray Place 2 sprays into both nostrils 2 (two) times daily. Use in each nostril as directed    benzonatate (TESSALON) 100 MG capsule Take 100 mg by mouth 3 (three) times daily as needed for cough.    candesartan (ATACAND) 8 MG tablet Take 1 tablet by mouth 2 (two) times daily.    carbamazepine (TEGRETOL XR) 100 MG 12 hr tablet Take 100 mg by mouth 4 (four) times daily.    cetirizine (ZYRTEC) 10 MG tablet Take 10 mg by mouth daily.    ezetimibe (ZETIA) 10 MG tablet Take 5 mg by mouth daily.    fluticasone (FLONASE) 50  MCG/ACT nasal spray Place 2 sprays into both nostrils daily.    gabapentin (NEURONTIN) 100 MG capsule Take 100 mg by mouth 3 (three) times daily.    levothyroxine (SYNTHROID, LEVOTHROID) 175 MCG tablet Take 175 mcg by mouth daily before breakfast.    memantine (NAMENDA) 5 MG tablet Take 5 mg by mouth 2 (two) times daily.    promethazine (PHENERGAN) 25 MG tablet Take 25 mg by mouth every 6 (six) hours as needed for nausea or vomiting.    ranitidine (ZANTAC) 300 MG tablet Take 300 mg by mouth at bedtime.    tamsulosin (FLOMAX) 0.4 MG CAPS capsule Take 0.4 mg by mouth daily.    traZODone (DESYREL) 50 MG tablet Take 50 mg by mouth at bedtime.      STOP taking these medications     levofloxacin (LEVAQUIN) 500 MG tablet          DISCHARGE INSTRUCTIONS:    Follow with PMD in 1 week.  If you experience worsening of your admission symptoms, develop shortness of breath, life threatening emergency, suicidal or homicidal thoughts you must seek medical attention immediately by calling 911 or calling your MD immediately  if symptoms less severe.  You Must read complete instructions/literature along with all the possible adverse reactions/side effects for all the Medicines you take and that have been prescribed to you. Take any new Medicines after you have completely understood and accept all the possible adverse reactions/side effects.   Please note  You were cared for by a hospitalist during your hospital stay. If you have any questions about your discharge medications or the care you received while you were in the hospital after you are discharged, you can call the unit and asked to speak with the hospitalist on call if the hospitalist that took care of you is not available. Once you are discharged, your primary care physician will handle any further medical issues. Please note that NO REFILLS for any discharge medications will be authorized once you are discharged, as it is imperative that  you return to your primary care physician (or establish a relationship with a primary care physician if you do not have one) for your aftercare needs so that they can reassess your need for medications and monitor your lab values.    Today   CHIEF COMPLAINT:   Chief Complaint  Patient presents with  . Shortness of Breath    HISTORY OF PRESENT ILLNESS:  Ambrosio Reuter  is a 71 y.o. male with a known history of hypertension presents to the emergency department complaining of shortness of breath. the patient had been seen in the hospital for pneumonia 2 weeks ago but has not significantly decreased coughing since that time. He has paroxysms of cough that make him feel as if "his throat  may close up". His cough is productive of white sputum that is occasionally frothy but other times of thick. In the emergency department the patient required supplemental oxygen and had increased work of breathing. Respiratory rate was as high as 35 at the bedside.chest x-ray was negative and CT of the chest did not show any infiltrate. After some time the patient's respiratory rate improved but he still required supplemental oxygen which prompted emergency department staff to call hospitalist service for admission.  VITAL SIGNS:  Blood pressure (!) 147/70, pulse 79, temperature 98.8 F (37.1 C), temperature source Oral, resp. rate 18, height 5\' 4"  (1.626 m), weight 108.3 kg (238 lb 11.2 oz), SpO2 97 %.  I/O:   Intake/Output Summary (Last 24 hours) at 12/28/16 0907 Last data filed at 12/27/16 1800  Gross per 24 hour  Intake              680 ml  Output                0 ml  Net              680 ml    PHYSICAL EXAMINATION:   GENERAL:  71 y.o.-year-old patient lying in the bed with no acute distress.  EYES: Pupils equal, round, reactive to light and accommodation. No scleral icterus. Extraocular muscles intact.  HEENT: Head atraumatic, normocephalic. Oropharynx and nasopharynx clear.  NECK:  Supple, no jugular  venous distention. No thyroid enlargement, no tenderness.  LUNGS: Moderate coarse breath sounds bilaterally, no wheezing, rales,rhonchi or crepitation. No use of accessory muscles of respiration.  CARDIOVASCULAR: S1, S2 normal. No murmurs, rubs, or gallops.  ABDOMEN: Soft, nontender, nondistended. Bowel sounds present. No organomegaly or mass.  EXTREMITIES: No pedal edema, cyanosis, or clubbing.  NEUROLOGIC: Cranial nerves II through XII are intact. Muscle strength 5/5 in all extremities. Sensation intact. Gait not checked.  PSYCHIATRIC: The patient is alert and oriented x 3.  SKIN: No obvious rash, lesion, or ulcer.   DATA REVIEW:   CBC  Recent Labs Lab 12/27/16 0251  WBC 6.4  HGB 15.9  HCT 44.6  PLT 139*    Chemistries   Recent Labs Lab 12/28/16 0436  NA 129*  K 4.3  CL 97*  CO2 23  GLUCOSE 126*  BUN 20  CREATININE 0.92  CALCIUM 9.0    Cardiac Enzymes  Recent Labs Lab 12/27/16 0251  TROPONINI <0.03    Microbiology Results  Results for orders placed or performed during the hospital encounter of 11/04/16  Culture, blood (routine x 2)     Status: None   Collection Time: 11/04/16 11:50 PM  Result Value Ref Range Status   Specimen Description BLOOD BLOOD RIGHT FOREARM  Final   Special Requests   Final    BOTTLES DRAWN AEROBIC AND ANAEROBIC Blood Culture adequate volume   Culture NO GROWTH 5 DAYS  Final   Report Status 11/10/2016 FINAL  Final  Culture, blood (routine x 2)     Status: None   Collection Time: 11/04/16 11:50 PM  Result Value Ref Range Status   Specimen Description BLOOD BLOOD RIGHT HAND  Final   Special Requests   Final    BOTTLES DRAWN AEROBIC AND ANAEROBIC Blood Culture adequate volume   Culture NO GROWTH 5 DAYS  Final   Report Status 11/10/2016 FINAL  Final    RADIOLOGY:  Ct Angio Chest Pe W And/or Wo Contrast  Result Date: 12/27/2016 CLINICAL DATA:  Shortness of breath, on Levaquin for  pneumonia. EXAM: CT ANGIOGRAPHY CHEST WITH  CONTRAST TECHNIQUE: Multidetector CT imaging of the chest was performed using the standard protocol during bolus administration of intravenous contrast. Multiplanar CT image reconstructions and MIPs were obtained to evaluate the vascular anatomy. CONTRAST:  75 cc Isovue 370 COMPARISON:  Chest radiograph December 27, 2016 at 0248 hours FINDINGS: CARDIOVASCULAR: Adequate contrast opacification of the pulmonary artery's. Main pulmonary artery is not enlarged. No pulmonary arterial filling defects to the level of the subsegmental branches. Heart size is normal, no right heart strain. No pericardial effusion. Thoracic aorta is normal course and caliber, mild intimal thickening and calcific atherosclerosis. MEDIASTINUM/NODES: No lymphadenopathy by CT size criteria. 10 mm short access RIGHT peritracheal lymph node, nonspecific. LUNGS/PLEURA: Tracheobronchial tree is patent, no pneumothorax. Mild bronchial wall thickening. No pleural effusions, focal consolidations, pulmonary nodules or masses. UPPER ABDOMEN: Mildly hypodense liver seen with steatosis. No acute process in the upper abdomen. Small hiatal hernia. MUSCULOSKELETAL: Visualized soft tissues and included osseous structures appear normal. Review of the MIP images confirms the above findings. IMPRESSION: Item 1. No acute pulmonary embolism. 2. Bronchial wall thickening seen with bronchitis and reactive airway disease. No focal consolidation. Aortic Atherosclerosis (ICD10-I70.0). Electronically Signed   By: Elon Alas M.D.   On: 12/27/2016 04:50   Dg Chest Port 1 View  Result Date: 12/27/2016 CLINICAL DATA:  Pneumonia. Shortness of breath. History of hypertension. EXAM: PORTABLE CHEST 1 VIEW COMPARISON:  Chest radiograph December 16, 2016 FINDINGS: The heart size and mediastinal contours are within normal limits. Both lungs are clear. Minimal LEFT lung base scarring. The visualized skeletal structures are unremarkable. IMPRESSION: No acute cardiopulmonary  process. Electronically Signed   By: Elon Alas M.D.   On: 12/27/2016 03:35    EKG:   Orders placed or performed during the hospital encounter of 12/27/16  . ED EKG  . ED EKG      Management plans discussed with the patient, family and they are in agreement.  CODE STATUS: full.    Code Status Orders        Start     Ordered   12/27/16 0720  Full code  Continuous     12/27/16 0719    Code Status History    Date Active Date Inactive Code Status Order ID Comments User Context   11/05/2016  2:03 AM 11/06/2016  5:17 PM Full Code [id number]  Hugelmeyer, Ubaldo Glassing, DO Inpatient      TOTAL TIME TAKING CARE OF THIS PATIENT: 35 minutes.    Vaughan Basta M.D on 12/28/2016 at 9:07 AM  Between 7am to 6pm - Pager - [phone]  After 6pm go to [email or web address] - password [email or web address] [facility]  Office  [phone]  CC: Primary care physician; Elaina Pattee, MD   Note: This dictation was prepared with Dragon dictation along with smaller phrase technology. Any transcriptional errors that result from this process are unintentional.

## 2016-12-28 NOTE — Care Management Note (Signed)
Case Management Note  Patient Details  Name: Brian Huffman. MRN: [id number] Date of Birth: 04-24-1945  Subjective/Objective:  Admitted to [facility] unde observation status with the diagnosis of respiratory distress. Lives with wife, Maudie Mercury [phone]). Sister is Mardene Celeste [phone]). Prescriptions are filled at [facility] on [address]. No home Health. No skilled facility. No home oxygen. No medical equipment in the home.Takes care of all basic and instrumental activities of daily himself, drives. States his wife has had a stroke and he has to help out more in the home.  Last fall was a month ago. Good appetite.                 Action/Plan: No follow up needs identified.  Discharge to home today per Dr. Anselm Jungling   Expected Discharge Date:  12/28/16               Expected Discharge Plan:     In-House Referral:     Discharge planning Services     Post Acute Care Choice:    Choice offered to:     DME Arranged:    DME Agency:     HH Arranged:    HH Agency:     Status of Service:     If discussed at [employer] of Stay Meetings, dates discussed:    Additional Comments:  Shelbie Ammons, RN MSN CCM Care Management [phone] 12/28/2016, 9:33 AM

## 2016-12-28 NOTE — Progress Notes (Signed)
Echo reviewed, Anteroseptal wall hypokinesis noted, mildly depressed EF 45% Would consider outpt stress test once he has recovered. Would wait until able to lay flat without SOB or cough  Signed, Esmond Plants, MD, Ph.D [facility]

## 2017-01-02 DIAGNOSIS — G500 Trigeminal neuralgia: Secondary | ICD-10-CM | POA: Diagnosis not present

## 2017-01-02 DIAGNOSIS — I5031 Acute diastolic (congestive) heart failure: Secondary | ICD-10-CM | POA: Diagnosis not present

## 2017-01-02 DIAGNOSIS — E038 Other specified hypothyroidism: Secondary | ICD-10-CM | POA: Diagnosis not present

## 2017-01-02 DIAGNOSIS — K219 Gastro-esophageal reflux disease without esophagitis: Secondary | ICD-10-CM | POA: Diagnosis not present

## 2017-01-02 DIAGNOSIS — E063 Autoimmune thyroiditis: Secondary | ICD-10-CM | POA: Diagnosis not present

## 2017-01-02 DIAGNOSIS — I10 Essential (primary) hypertension: Secondary | ICD-10-CM | POA: Diagnosis not present

## 2017-01-02 DIAGNOSIS — M109 Gout, unspecified: Secondary | ICD-10-CM | POA: Diagnosis not present

## 2017-01-02 DIAGNOSIS — E7800 Pure hypercholesterolemia, unspecified: Secondary | ICD-10-CM | POA: Diagnosis not present

## 2017-01-02 DIAGNOSIS — J310 Chronic rhinitis: Secondary | ICD-10-CM | POA: Diagnosis not present

## 2017-01-02 DIAGNOSIS — J301 Allergic rhinitis due to pollen: Secondary | ICD-10-CM | POA: Diagnosis not present

## 2017-01-02 DIAGNOSIS — F5104 Psychophysiologic insomnia: Secondary | ICD-10-CM | POA: Diagnosis not present

## 2017-02-24 DIAGNOSIS — I159 Secondary hypertension, unspecified: Secondary | ICD-10-CM | POA: Diagnosis not present

## 2017-02-24 DIAGNOSIS — G500 Trigeminal neuralgia: Secondary | ICD-10-CM | POA: Diagnosis not present

## 2017-02-24 DIAGNOSIS — F5104 Psychophysiologic insomnia: Secondary | ICD-10-CM | POA: Diagnosis not present

## 2017-02-24 DIAGNOSIS — M109 Gout, unspecified: Secondary | ICD-10-CM | POA: Diagnosis not present

## 2017-02-24 DIAGNOSIS — E063 Autoimmune thyroiditis: Secondary | ICD-10-CM | POA: Diagnosis not present

## 2017-02-24 DIAGNOSIS — E038 Other specified hypothyroidism: Secondary | ICD-10-CM | POA: Diagnosis not present

## 2017-02-24 DIAGNOSIS — J320 Chronic maxillary sinusitis: Secondary | ICD-10-CM | POA: Diagnosis not present

## 2017-03-13 ENCOUNTER — Ambulatory Visit: Admission: RE | Admit: 2017-03-13 | Payer: PPO | Source: Ambulatory Visit | Admitting: Unknown Physician Specialty

## 2017-03-13 ENCOUNTER — Encounter: Admission: RE | Payer: Self-pay | Source: Ambulatory Visit

## 2017-03-13 SURGERY — COLONOSCOPY WITH PROPOFOL
Anesthesia: General

## 2017-03-15 DIAGNOSIS — J019 Acute sinusitis, unspecified: Secondary | ICD-10-CM | POA: Diagnosis not present

## 2017-03-15 DIAGNOSIS — B9689 Other specified bacterial agents as the cause of diseases classified elsewhere: Secondary | ICD-10-CM | POA: Diagnosis not present

## 2017-04-06 DIAGNOSIS — J069 Acute upper respiratory infection, unspecified: Secondary | ICD-10-CM | POA: Diagnosis not present

## 2017-04-06 DIAGNOSIS — J019 Acute sinusitis, unspecified: Secondary | ICD-10-CM | POA: Diagnosis not present

## 2017-04-21 ENCOUNTER — Ambulatory Visit (INDEPENDENT_AMBULATORY_CARE_PROVIDER_SITE_OTHER): Payer: PPO

## 2017-04-21 ENCOUNTER — Other Ambulatory Visit: Payer: Self-pay

## 2017-04-21 ENCOUNTER — Ambulatory Visit
Admission: EM | Admit: 2017-04-21 | Discharge: 2017-04-21 | Disposition: A | Discharge status: Home / Self Care | Payer: PPO | Attending: Family Medicine | Admitting: Family Medicine

## 2017-04-21 DIAGNOSIS — R05 Cough: Secondary | ICD-10-CM

## 2017-04-21 DIAGNOSIS — R053 Chronic cough: Secondary | ICD-10-CM

## 2017-04-21 MED ORDER — PREDNISONE 20 MG PO TABS: ORAL_TABLET | ORAL | 0 refills | Status: DC

## 2017-04-21 NOTE — ED Triage Notes (Signed)
Pt reports sick "since July". Has had respiratory infections off and on since then. Has been on several antibiotics and improves but always comes back again when he finished abx. Has cough productive of white phlegm and clear nasal drainage. No fever. Denies pain "except throat sometimes hurts when I cough hard."

## 2017-04-21 NOTE — ED Provider Notes (Signed)
[facility]    CSN: [id number] Arrival date & time: 04/21/17  1328     History   Chief Complaint Chief Complaint  Patient presents with  . Cough    HPI [facility] Brian Huffman. is a 72 y.o. male.   72 yo male with a c/o chronic cough since July. States he's been in the hospital and outpatient visits, treated with different antibiotics which help but states "cough comes back". Denies any fevers, chills, chest pain, shortness of breath.    The history is provided by the patient.  Cough    Past Medical History:  Diagnosis Date  . HLD (hyperlipidemia)   . Hypertension   . Hypothyroidism   . Obesity   . Trigeminal neuralgia   . Vertigo     Patient Active Problem List   Diagnosis Date Noted  . Respiratory distress 12/27/2016  . Bronchitis 12/27/2016  . Reactive airway disease 12/27/2016  . Diastolic dysfunction 61/95/0932  . Essential hypertension 12/27/2016  . Community acquired pneumonia 11/05/2016    History reviewed. No pertinent surgical history.     Home Medications    Prior to Admission medications   Medication Sig Start Date End Date Taking? Authorizing Provider  albuterol (PROVENTIL HFA;VENTOLIN HFA) 108 (90 Base) MCG/ACT inhaler Inhale 2 puffs into the lungs every 6 (six) hours as needed for wheezing or shortness of breath. 11/06/16   Epifanio Lesches, MD  allopurinol (ZYLOPRIM) 100 MG tablet Take 1 tablet by mouth daily. 09/18/16   [provider]  amLODipine (NORVASC) 5 MG tablet Take 5 mg by mouth daily.    [provider]  atorvastatin (LIPITOR) 10 MG tablet Take 10 mg by mouth daily.    [provider]  azelastine (ASTELIN) 0.1 % nasal spray Place 2 sprays into both nostrils 2 (two) times daily. Use in each nostril as directed    [provider]  benzonatate (TESSALON) 100 MG capsule Take 100 mg by mouth 3 (three) times daily as needed for cough.    [provider]  candesartan (ATACAND) 8 MG  tablet Take 1 tablet by mouth 2 (two) times daily. 10/27/16   [provider]  carbamazepine (TEGRETOL XR) 100 MG 12 hr tablet Take 100 mg by mouth 4 (four) times daily.    [provider]  cetirizine (ZYRTEC) 10 MG tablet Take 10 mg by mouth daily.    [provider]  ezetimibe (ZETIA) 10 MG tablet Take 5 mg by mouth daily.    [provider]  fluticasone (FLONASE) 50 MCG/ACT nasal spray Place 2 sprays into both nostrils daily.    [provider]  gabapentin (NEURONTIN) 100 MG capsule Take 100 mg by mouth 3 (three) times daily.    [provider]  guaiFENesin-codeine 100-10 MG/5ML syrup Take 10 mLs by mouth 3 (three) times daily as needed for cough. 12/28/16   Vaughan Basta, MD  levothyroxine (SYNTHROID, LEVOTHROID) 175 MCG tablet Take 175 mcg by mouth daily before breakfast.    [provider]  memantine (NAMENDA) 5 MG tablet Take 5 mg by mouth 2 (two) times daily.    [provider]  predniSONE (DELTASONE) 20 MG tablet 3 tabs po once day 1, then 2 tabs po qd x 2 days, then 1 tab po qd x 2 days, then half a tab po qd x 2 days 04/21/17   Norval Gable, MD  promethazine (PHENERGAN) 25 MG tablet Take 25 mg by mouth every 6 (six) hours as  needed for nausea or vomiting.    [provider]  ranitidine (ZANTAC) 300 MG tablet Take 300 mg by mouth at bedtime.    [provider]  tamsulosin (FLOMAX) 0.4 MG CAPS capsule Take 0.4 mg by mouth daily.    [provider]  tiotropium (SPIRIVA) 18 MCG inhalation capsule Place 1 capsule (18 mcg total) into inhaler and inhale daily. 12/28/16   Vaughan Basta, MD  traZODone (DESYREL) 50 MG tablet Take 50 mg by mouth at bedtime.    [provider]    Family History Family History  Problem Relation Age of Onset  . Stroke Mother   . Diabetes Mellitus II Father   . Diabetes Mellitus II Sister     Social History Social History   Tobacco Use    . Smoking status: Never Smoker  . Smokeless tobacco: Never Used  Substance Use Topics  . Alcohol use: No  . Drug use: No     Allergies   [place] grass extract; Other; Statins; Penicillins; and Shellfish-derived products   Review of Systems Review of Systems  Respiratory: Positive for cough.      Physical Exam Triage Vital Signs ED Triage Vitals  Enc Vitals Group     BP 04/21/17 1338 (!) 174/84     Pulse Rate 04/21/17 1338 83     Resp 04/21/17 1338 20     Temp 04/21/17 1338 97.6 F (36.4 C)     Temp Source 04/21/17 1338 Oral     SpO2 04/21/17 1338 99 %     Weight 04/21/17 1339 240 lb (108.9 kg)     Height 04/21/17 1339 5\' 4"  (1.626 m)     Head Circumference --      Peak Flow --      Pain Score --      Pain Loc --      Pain Edu? --      Excl. in [place]? --    No data found.  Updated Vital Signs BP (!) 174/84 (BP Location: Right Arm)   Pulse 83   Temp 97.6 F (36.4 C) (Oral)   Resp 20   Ht 5\' 4"  (1.626 m)   Wt 240 lb (108.9 kg)   SpO2 99%   BMI 41.20 kg/m   Visual Acuity Right Eye Distance:   Left Eye Distance:   Bilateral Distance:    Right Eye Near:   Left Eye Near:    Bilateral Near:     Physical Exam  Constitutional: He appears well-developed and well-nourished. No distress.  HENT:  Head: Normocephalic and atraumatic.  Right Ear: Tympanic membrane, external ear and ear canal normal.  Left Ear: Tympanic membrane, external ear and ear canal normal.  Nose: Nose normal.  Mouth/Throat: Uvula is midline, oropharynx is clear and moist and mucous membranes are normal. No oropharyngeal exudate or tonsillar abscesses.  Eyes: Conjunctivae and EOM are normal. Pupils are equal, round, and reactive to light. Right eye exhibits no discharge. Left eye exhibits no discharge. No scleral icterus.  Neck: Normal range of motion. Neck supple. No tracheal deviation present. No thyromegaly present.  Cardiovascular: Normal rate, regular rhythm and normal heart sounds.   Pulmonary/Chest: Effort normal and breath sounds normal. No stridor. No respiratory distress. He has no wheezes. He has no rales. He exhibits no tenderness.  Lymphadenopathy:    He has no cervical adenopathy.  Neurological: He is alert.  Skin: Skin is warm and dry. No rash noted. He is not diaphoretic.  Nursing note and vitals reviewed.    UC Treatments / Results  Labs (all labs ordered are listed, but only abnormal results are displayed) Labs Reviewed - No data to display  EKG  EKG Interpretation None       Radiology Dg Chest 2 View  Result Date: 04/21/2017 CLINICAL DATA:  Cough. EXAM: CHEST  2 VIEW COMPARISON:  CT 12/27/2016.  Chest x-ray 12/27/2016. FINDINGS: Mediastinum hilar structures normal. Mild left base subsegmental atelectasis. No pleural effusion or pneumothorax. Biapical pleural thickening consistent with scarring. Mild cardiomegaly. No pulmonary venous congestion. IMPRESSION: 1. Mild left base subsegmental atelectasis. 2. Mild cardiomegaly. Electronically Signed   By: Marcello Moores  Register   On: 04/21/2017 14:42    Procedures Procedures (including critical care time)  Medications Ordered in UC Medications - No data to display   Initial Impression / Assessment and Plan / UC Course  I have reviewed the triage vital signs and the nursing notes.  Pertinent labs & imaging results that were available during my care of the patient were reviewed by me and considered in my medical decision making (see chart for details).       Final Clinical Impressions(s) / UC Diagnoses   Final diagnoses:  Chronic cough    ED Discharge Orders        Ordered    predniSONE (DELTASONE) 20 MG tablet     04/21/17 1500     1. x-ray results and diagnosis reviewed with patient 2. rx as per orders above; reviewed possible side effects, interactions, risks and benefits  3. Follow up with PCP for further evaluation and/or possible referral to pulmonologist 4. Follow-up prn if symptoms  worsen or don't improve  Controlled Substance Prescriptions [place] Controlled Substance Registry consulted? Not Applicable   Norval Gable, MD 04/21/17 [phone]

## 2017-04-21 NOTE — Discharge Instructions (Signed)
Follow up with PCP for possible referral to pulmonologist

## 2017-07-10 DIAGNOSIS — E039 Hypothyroidism, unspecified: Secondary | ICD-10-CM | POA: Diagnosis not present

## 2017-07-10 DIAGNOSIS — I10 Essential (primary) hypertension: Secondary | ICD-10-CM | POA: Diagnosis not present

## 2017-07-10 DIAGNOSIS — F5104 Psychophysiologic insomnia: Secondary | ICD-10-CM | POA: Diagnosis not present

## 2017-07-10 DIAGNOSIS — K219 Gastro-esophageal reflux disease without esophagitis: Secondary | ICD-10-CM | POA: Diagnosis not present

## 2017-07-10 DIAGNOSIS — Z125 Encounter for screening for malignant neoplasm of prostate: Secondary | ICD-10-CM | POA: Diagnosis not present

## 2017-07-10 DIAGNOSIS — E7800 Pure hypercholesterolemia, unspecified: Secondary | ICD-10-CM | POA: Diagnosis not present

## 2017-07-10 DIAGNOSIS — G500 Trigeminal neuralgia: Secondary | ICD-10-CM | POA: Diagnosis not present

## 2017-07-10 DIAGNOSIS — E790 Hyperuricemia without signs of inflammatory arthritis and tophaceous disease: Secondary | ICD-10-CM | POA: Diagnosis not present

## 2017-07-10 DIAGNOSIS — N400 Enlarged prostate without lower urinary tract symptoms: Secondary | ICD-10-CM | POA: Diagnosis not present

## 2017-07-21 DIAGNOSIS — A499 Bacterial infection, unspecified: Secondary | ICD-10-CM | POA: Diagnosis not present

## 2017-07-21 DIAGNOSIS — B9689 Other specified bacterial agents as the cause of diseases classified elsewhere: Secondary | ICD-10-CM | POA: Diagnosis not present

## 2017-07-21 DIAGNOSIS — R197 Diarrhea, unspecified: Secondary | ICD-10-CM | POA: Diagnosis not present

## 2017-07-21 DIAGNOSIS — R112 Nausea with vomiting, unspecified: Secondary | ICD-10-CM | POA: Diagnosis not present

## 2017-07-21 DIAGNOSIS — H66001 Acute suppurative otitis media without spontaneous rupture of ear drum, right ear: Secondary | ICD-10-CM | POA: Diagnosis not present

## 2017-07-21 DIAGNOSIS — N390 Urinary tract infection, site not specified: Secondary | ICD-10-CM | POA: Diagnosis not present

## 2017-07-21 DIAGNOSIS — J019 Acute sinusitis, unspecified: Secondary | ICD-10-CM | POA: Diagnosis not present

## 2017-07-28 DIAGNOSIS — Z800 Family history of malignant neoplasm of digestive organs: Secondary | ICD-10-CM | POA: Diagnosis not present

## 2017-07-28 DIAGNOSIS — Z86010 Personal history of colonic polyps: Secondary | ICD-10-CM | POA: Diagnosis not present

## 2017-08-25 DIAGNOSIS — G500 Trigeminal neuralgia: Secondary | ICD-10-CM | POA: Diagnosis not present

## 2017-08-25 DIAGNOSIS — I159 Secondary hypertension, unspecified: Secondary | ICD-10-CM | POA: Diagnosis not present

## 2017-10-10 DIAGNOSIS — I10 Essential (primary) hypertension: Secondary | ICD-10-CM | POA: Diagnosis not present

## 2017-10-10 DIAGNOSIS — E790 Hyperuricemia without signs of inflammatory arthritis and tophaceous disease: Secondary | ICD-10-CM | POA: Diagnosis not present

## 2017-10-10 DIAGNOSIS — F5101 Primary insomnia: Secondary | ICD-10-CM | POA: Diagnosis not present

## 2017-10-10 DIAGNOSIS — E039 Hypothyroidism, unspecified: Secondary | ICD-10-CM | POA: Diagnosis not present

## 2017-10-10 DIAGNOSIS — R300 Dysuria: Secondary | ICD-10-CM | POA: Diagnosis not present

## 2017-10-10 DIAGNOSIS — E7800 Pure hypercholesterolemia, unspecified: Secondary | ICD-10-CM | POA: Diagnosis not present

## 2017-10-10 DIAGNOSIS — Z6841 Body Mass Index (BMI) 40.0 and over, adult: Secondary | ICD-10-CM | POA: Diagnosis not present

## 2017-10-10 DIAGNOSIS — N400 Enlarged prostate without lower urinary tract symptoms: Secondary | ICD-10-CM | POA: Diagnosis not present

## 2017-10-12 ENCOUNTER — Encounter: Payer: Self-pay | Admitting: *Deleted

## 2017-10-13 ENCOUNTER — Ambulatory Visit: Admission: RE | Admit: 2017-10-13 | Payer: PPO | Source: Ambulatory Visit | Admitting: Unknown Physician Specialty

## 2017-10-13 ENCOUNTER — Encounter: Admission: RE | Payer: Self-pay | Source: Ambulatory Visit

## 2017-10-13 HISTORY — DX: Allergy status to unspecified drugs, medicaments and biological substances: Z88.9

## 2017-10-13 HISTORY — DX: Family history of malignant neoplasm of digestive organs: Z80.0

## 2017-10-13 HISTORY — DX: Gout, unspecified: M10.9

## 2017-10-13 SURGERY — COLONOSCOPY WITH PROPOFOL
Anesthesia: General

## 2017-10-17 DIAGNOSIS — E871 Hypo-osmolality and hyponatremia: Secondary | ICD-10-CM | POA: Diagnosis not present

## 2018-01-09 ENCOUNTER — Encounter: Payer: Self-pay | Admitting: Emergency Medicine

## 2018-01-10 ENCOUNTER — Ambulatory Visit: Payer: PPO | Admitting: Anesthesiology

## 2018-01-10 ENCOUNTER — Ambulatory Visit
Admission: RE | Admit: 2018-01-10 | Discharge: 2018-01-10 | Disposition: A | Discharge status: Home / Self Care | Payer: PPO | Source: Ambulatory Visit | Attending: Unknown Physician Specialty | Admitting: Unknown Physician Specialty

## 2018-01-10 ENCOUNTER — Encounter
Admission: RE | Disposition: A | Discharge status: Home / Self Care | Payer: Self-pay | Source: Ambulatory Visit | Attending: Unknown Physician Specialty

## 2018-01-10 ENCOUNTER — Other Ambulatory Visit: Payer: Self-pay

## 2018-01-10 DIAGNOSIS — E039 Hypothyroidism, unspecified: Secondary | ICD-10-CM | POA: Insufficient documentation

## 2018-01-10 DIAGNOSIS — E785 Hyperlipidemia, unspecified: Secondary | ICD-10-CM | POA: Diagnosis not present

## 2018-01-10 DIAGNOSIS — Z09 Encounter for follow-up examination after completed treatment for conditions other than malignant neoplasm: Secondary | ICD-10-CM | POA: Diagnosis not present

## 2018-01-10 DIAGNOSIS — M109 Gout, unspecified: Secondary | ICD-10-CM | POA: Diagnosis not present

## 2018-01-10 DIAGNOSIS — Z1211 Encounter for screening for malignant neoplasm of colon: Secondary | ICD-10-CM | POA: Diagnosis not present

## 2018-01-10 DIAGNOSIS — Z86010 Personal history of colonic polyps: Secondary | ICD-10-CM | POA: Insufficient documentation

## 2018-01-10 DIAGNOSIS — Z7951 Long term (current) use of inhaled steroids: Secondary | ICD-10-CM | POA: Insufficient documentation

## 2018-01-10 DIAGNOSIS — I10 Essential (primary) hypertension: Secondary | ICD-10-CM | POA: Diagnosis not present

## 2018-01-10 DIAGNOSIS — D126 Benign neoplasm of colon, unspecified: Secondary | ICD-10-CM | POA: Diagnosis not present

## 2018-01-10 DIAGNOSIS — E669 Obesity, unspecified: Secondary | ICD-10-CM | POA: Diagnosis not present

## 2018-01-10 DIAGNOSIS — Z6841 Body Mass Index (BMI) 40.0 and over, adult: Secondary | ICD-10-CM | POA: Diagnosis not present

## 2018-01-10 DIAGNOSIS — K640 First degree hemorrhoids: Secondary | ICD-10-CM | POA: Insufficient documentation

## 2018-01-10 DIAGNOSIS — Z79890 Hormone replacement therapy (postmenopausal): Secondary | ICD-10-CM | POA: Insufficient documentation

## 2018-01-10 DIAGNOSIS — K635 Polyp of colon: Secondary | ICD-10-CM | POA: Diagnosis not present

## 2018-01-10 DIAGNOSIS — Z800 Family history of malignant neoplasm of digestive organs: Secondary | ICD-10-CM | POA: Insufficient documentation

## 2018-01-10 DIAGNOSIS — Z79899 Other long term (current) drug therapy: Secondary | ICD-10-CM | POA: Diagnosis not present

## 2018-01-10 DIAGNOSIS — D122 Benign neoplasm of ascending colon: Secondary | ICD-10-CM | POA: Insufficient documentation

## 2018-01-10 DIAGNOSIS — J45909 Unspecified asthma, uncomplicated: Secondary | ICD-10-CM | POA: Diagnosis not present

## 2018-01-10 DIAGNOSIS — K219 Gastro-esophageal reflux disease without esophagitis: Secondary | ICD-10-CM | POA: Diagnosis not present

## 2018-01-10 HISTORY — PX: COLONOSCOPY WITH PROPOFOL: SHX5780

## 2018-01-10 SURGERY — COLONOSCOPY WITH PROPOFOL
Anesthesia: General

## 2018-01-10 MED ORDER — ONDANSETRON HCL 4 MG/2ML IJ SOLN
INTRAMUSCULAR | Status: DC | PRN
  Administered 2018-01-10: 4 mg via INTRAVENOUS

## 2018-01-10 MED ORDER — PROPOFOL 500 MG/50ML IV EMUL
INTRAVENOUS | Status: AC
  Filled 2018-01-10: qty 50

## 2018-01-10 MED ORDER — SODIUM CHLORIDE 0.9 % IV SOLN
INTRAVENOUS | Status: DC
  Administered 2018-01-10: 10:00:00 via INTRAVENOUS

## 2018-01-10 MED ORDER — SODIUM CHLORIDE 0.9 % IV SOLN: INTRAVENOUS | Status: DC

## 2018-01-10 MED ORDER — LIDOCAINE HCL (PF) 2 % IJ SOLN
INTRAMUSCULAR | Status: AC
  Filled 2018-01-10: qty 10

## 2018-01-10 MED ORDER — LIDOCAINE HCL (CARDIAC) PF 100 MG/5ML IV SOSY
PREFILLED_SYRINGE | INTRAVENOUS | Status: DC | PRN
  Administered 2018-01-10: 50 mg via INTRAVENOUS

## 2018-01-10 MED ORDER — PROPOFOL 10 MG/ML IV BOLUS
INTRAVENOUS | Status: DC | PRN
  Administered 2018-01-10: 70 mg via INTRAVENOUS

## 2018-01-10 MED ORDER — PROPOFOL 500 MG/50ML IV EMUL
INTRAVENOUS | Status: DC | PRN
  Administered 2018-01-10: 175 ug/kg/min via INTRAVENOUS

## 2018-01-10 NOTE — Transfer of Care (Signed)
Immediate Anesthesia Transfer of Care Note  Patient: Brian Huffman.  Procedure(s) Performed: COLONOSCOPY WITH PROPOFOL (N/A )  Patient Location: PACU  Anesthesia Type:General  Level of Consciousness: sedated  Airway & Oxygen Therapy: Patient Spontanous Breathing and Patient connected to nasal cannula oxygen  Post-op Assessment: Report given to RN and Post -op Vital signs reviewed and stable  Post vital signs: Reviewed and stable  Last Vitals:  Vitals Value Taken Time  BP 114/61 01/10/2018 11:05 AM  Temp 36.2 C 01/10/2018 11:02 AM  Pulse 65 01/10/2018 11:05 AM  Resp 14 01/10/2018 11:05 AM  SpO2 96 % 01/10/2018 11:05 AM  Vitals shown include unvalidated device data.  Last Pain:  Vitals:   01/10/18 1102  TempSrc: Tympanic  PainSc:          Complications: No apparent anesthesia complications

## 2018-01-10 NOTE — Anesthesia Procedure Notes (Signed)
Date/Time: 01/10/2018 10:31 AM Performed by: Johnna Acosta, CRNA Pre-anesthesia Checklist: Patient identified, Emergency Drugs available, Suction available, Patient being monitored and Timeout performed Patient Re-evaluated:Patient Re-evaluated prior to induction Oxygen Delivery Method: Nasal cannula Preoxygenation: Pre-oxygenation with 100% oxygen Induction Type: IV induction

## 2018-01-10 NOTE — H&P (Signed)
Primary Care Physician:  Elaina Pattee, MD Primary Gastroenterologist:  Dr. Vira Agar  Pre-Procedure History & Physical: HPI:  Brian Huffman. is a 72 y.o. male is here for an colonoscopy. Done for [facility] colon polyps.   Past Medical History:  Diagnosis Date  . Allergic genetic state   . Family hx of colon cancer   . Gout   . HLD (hyperlipidemia)   . Hypertension   . Hypothyroidism   . Obesity   . Trigeminal neuralgia   . Trigeminal neuralgia   . Vertigo     Past Surgical History:  Procedure Laterality Date  . COLONOSCOPY W/ POLYPECTOMY  2002, 2003, 2007, 2010, 2015.   adenomatous polyps    Prior to Admission medications   Medication Sig Start Date End Date Taking? Authorizing Provider  albuterol (PROVENTIL HFA;VENTOLIN HFA) 108 (90 Base) MCG/ACT inhaler Inhale 2 puffs into the lungs every 6 (six) hours as needed for wheezing or shortness of breath. 11/06/16  Yes Epifanio Lesches, MD  amLODipine (NORVASC) 5 MG tablet Take 5 mg by mouth daily.   Yes [provider]  azelastine (ASTELIN) 0.1 % nasal spray Place 2 sprays into both nostrils 2 (two) times daily. Use in each nostril as directed   Yes [provider]  benzonatate (TESSALON) 100 MG capsule Take 100 mg by mouth 3 (three) times daily as needed for cough.   Yes [provider]  carbamazepine (TEGRETOL XR) 100 MG 12 hr tablet Take 100 mg by mouth 4 (four) times daily.   Yes [provider]  fluticasone (FLONASE) 50 MCG/ACT nasal spray Place 2 sprays into both nostrils daily.   Yes [provider]  guaiFENesin-codeine 100-10 MG/5ML syrup Take 10 mLs by mouth 3 (three) times daily as needed for cough. 12/28/16  Yes Vaughan Basta, MD  levothyroxine (SYNTHROID, LEVOTHROID) 175 MCG tablet Take 175 mcg by mouth daily before breakfast.   Yes [provider]  Multiple Vitamin (MULTIVITAMIN) tablet Take 1 tablet by mouth daily.   Yes [provider]   omeprazole (PRILOSEC) 20 MG capsule Take 20 mg by mouth 2 (two) times daily before a meal.   Yes [provider]  ondansetron (ZOFRAN-ODT) 4 MG disintegrating tablet Take 4 mg by mouth every 8 (eight) hours as needed for nausea or vomiting.   Yes [provider]  pravastatin (PRAVACHOL) 10 MG tablet Take 10 mg by mouth daily.   Yes [provider]  promethazine (PHENERGAN) 25 MG tablet Take 25 mg by mouth every 6 (six) hours as needed for nausea or vomiting.   Yes [provider]  ranitidine (ZANTAC) 300 MG tablet Take 300 mg by mouth 3 times/day as needed-between meals & bedtime.    Yes [provider]  tiotropium (SPIRIVA) 18 MCG inhalation capsule Place 1 capsule (18 mcg total) into inhaler and inhale daily. 12/28/16  Yes Vaughan Basta, MD  topiramate (TOPAMAX) 25 MG tablet Take 25 mg by mouth 2 (two) times daily.   Yes [provider]  valsartan (DIOVAN) 320 MG tablet Take 320 mg by mouth daily.   Yes [provider]  allopurinol (ZYLOPRIM) 100 MG tablet Take 1 tablet by mouth daily. 09/18/16   [provider]  atorvastatin (LIPITOR) 10 MG tablet Take 10 mg by mouth daily.    [provider]  candesartan (ATACAND) 8 MG tablet Take 1 tablet by mouth 2 (two) times daily. 10/27/16   [provider]  cetirizine (ZYRTEC) 10 MG  tablet Take 10 mg by mouth daily.    [provider]  ezetimibe (ZETIA) 10 MG tablet Take 5 mg by mouth daily.    [provider]  gabapentin (NEURONTIN) 100 MG capsule Take 100 mg by mouth 3 (three) times daily.    [provider]  memantine (NAMENDA) 5 MG tablet Take 5 mg by mouth 2 (two) times daily.    [provider]  predniSONE (DELTASONE) 20 MG tablet 3 tabs po once day 1, then 2 tabs po qd x 2 days, then 1 tab po qd x 2 days, then half a tab po qd x 2 days Patient not taking: Reported on 01/10/2018 04/21/17   Norval Gable, MD  tamsulosin  (FLOMAX) 0.4 MG CAPS capsule Take 0.4 mg by mouth daily.    [provider]  traZODone (DESYREL) 50 MG tablet Take 50 mg by mouth at bedtime.    [provider]    Allergies as of 11/20/2017 - Review Complete 10/12/2017  Allergen Reaction Noted  . [place] grass extract Other (See Comments) 10/11/2012  . Other Other (See Comments) 02/19/2016  . Statins Other (See Comments) 02/05/2013  . Penicillins Rash 11/04/2016  . Shellfish-derived products Rash 03/25/2014    Family History  Problem Relation Age of Onset  . Stroke Mother   . Diabetes Mellitus II Father   . Diabetes Mellitus II Sister     Social History   Socioeconomic History  . Marital status: Married    Spouse name: Not on file  . Number of children: Not on file  . Years of education: Not on file  . Highest education level: Not on file  Occupational History  . Not on file  Social Needs  . Financial resource strain: Not on file  . Food insecurity:    Worry: Not on file    Inability: Not on file  . Transportation needs:    Medical: Not on file    Non-medical: Not on file  Tobacco Use  . Smoking status: Never Smoker  . Smokeless tobacco: Never Used  Substance and Sexual Activity  . Alcohol use: No  . Drug use: No  . Sexual activity: Not on file  Lifestyle  . Physical activity:    Days per week: Not on file    Minutes per session: Not on file  . Stress: Not on file  Relationships  . Social connections:    Talks on phone: Not on file    Gets together: Not on file    Attends religious service: Not on file    Active member of club or organization: Not on file    Attends meetings of clubs or organizations: Not on file    Relationship status: Not on file  . Intimate partner violence:    Fear of current or ex partner: Not on file    Emotionally abused: Not on file    Physically abused: Not on file    Forced sexual activity: Not on file  Other Topics Concern  . Not on file  Social History  Narrative  . Not on file    Review of Systems: See HPI, otherwise negative ROS  Physical Exam: BP (!) 173/94   Pulse 75   Temp (!) 97.1 F (36.2 C) (Tympanic)   Resp 20   Ht 5\' 4"  (1.626 m)   Wt 111.1 kg   SpO2 99%   BMI 42.05 kg/m  General:   Alert,  pleasant and cooperative in NAD Head:  Normocephalic and atraumatic. Neck:  Supple; no masses or thyromegaly. Lungs:  Clear throughout to auscultation.    Heart:  Regular rate and rhythm. Abdomen:  Soft, nontender and nondistended. Normal bowel sounds, without guarding, and without rebound.   Neurologic:  Alert and  oriented x4;  grossly normal neurologically.  Impression/Plan: Allayne Butcher. is here for an colonoscopy to be performed for [facility] colon polyps.  Risks, benefits, limitations, and alternatives regarding  colonoscopy have been reviewed with the patient.  Questions have been answered.  All parties agreeable.   Gaylyn Cheers, MD  01/10/2018, 10:30 AM

## 2018-01-10 NOTE — Anesthesia Post-op Follow-up Note (Signed)
Anesthesia QCDR form completed.        

## 2018-01-10 NOTE — Anesthesia Preprocedure Evaluation (Signed)
Anesthesia Evaluation  Patient identified by MRN, date of birth, ID band Patient awake    Reviewed: Allergy & Precautions, H&P , NPO status , Patient's Chart, lab work & pertinent test results, reviewed documented beta blocker date and time   History of Anesthesia Complications Negative for: history of anesthetic complications  Airway Mallampati: III  TM Distance: >3 FB Neck ROM: full    Dental  (+) Dental Advidsory Given, Edentulous Upper, Edentulous Lower   Pulmonary shortness of breath and with exertion, asthma , neg sleep apnea, neg COPD, neg recent URI,           Cardiovascular Exercise Tolerance: Good hypertension, (-) angina(-) CAD, (-) Past MI, (-) Cardiac Stents and (-) CABG (-) dysrhythmias (-) Valvular Problems/Murmurs     Neuro/Psych neg Seizures  Neuromuscular disease negative psych ROS   GI/Hepatic Neg liver ROS, GERD  ,  Endo/Other  neg diabetesHypothyroidism   Renal/GU negative Renal ROS  negative genitourinary   Musculoskeletal   Abdominal   Peds  Hematology negative hematology ROS (+)   Anesthesia Other Findings Past Medical History: No date: Allergic genetic state No date: Family hx of colon cancer No date: Gout No date: HLD (hyperlipidemia) No date: Hypertension No date: Hypothyroidism No date: Obesity No date: Trigeminal neuralgia No date: Trigeminal neuralgia No date: Vertigo   Reproductive/Obstetrics negative OB ROS                             Anesthesia Physical Anesthesia Plan  ASA: III  Anesthesia Plan: General   Post-op Pain Management:    Induction: Intravenous  PONV Risk Score and Plan: 2 and Propofol infusion and TIVA  Airway Management Planned: Natural Airway and Nasal Cannula  Additional Equipment:   Intra-op Plan:   Post-operative Plan:   Informed Consent: I have reviewed the patients History and Physical, chart, labs and discussed the  procedure including the risks, benefits and alternatives for the proposed anesthesia with the patient or authorized representative who has indicated his/her understanding and acceptance.   Dental Advisory Given  Plan Discussed with: Anesthesiologist, CRNA and Surgeon  Anesthesia Plan Comments:         Anesthesia Quick Evaluation

## 2018-01-10 NOTE — Op Note (Signed)
[facility] Gastroenterology Patient Name: Brian Huffman Procedure Date: 01/10/2018 10:14 AM MRN: [id number] Account #: [id number] Date of Birth: Jan 10, 1946 Admit Type: Outpatient Age: 72 Room: [facility] ENDO ROOM 3 Gender: Male Note Status: Finalized Procedure:            Colonoscopy Indications:          High risk colon cancer surveillance: Personal history                        of colonic polyps Providers:            Manya Silvas, MD Medicines:            Propofol per Anesthesia Complications:        No immediate complications. Procedure:            Pre-Anesthesia Assessment:                       - After reviewing the risks and benefits, the patient                        was deemed in satisfactory condition to undergo the                        procedure.                       After obtaining informed consent, the colonoscope was                        passed under direct vision. Throughout the procedure,                        the patient's blood pressure, pulse, and oxygen                        saturations were monitored continuously. The                        Colonoscope was introduced through the anus and                        advanced to the the cecum, identified by appendiceal                        orifice and ileocecal valve. The colonoscopy was                        performed without difficulty. The patient tolerated the                        procedure well. The quality of the bowel preparation                        was adequate to identify polyps. Findings:      A medium polyp was found in the ascending colon. The polyp was sessile.       The polyp was removed with a hot snare. Resection and retrieval were       complete.      Internal hemorrhoids were found during endoscopy. The hemorrhoids were       small and Grade  I (internal hemorrhoids that do not prolapse).      Remainder of the colon showed no signs of polyps. Impression:           -  One medium polyp in the ascending colon, removed with                        a hot snare. Resected and retrieved.                       - Internal hemorrhoids. Recommendation:       - Await pathology results. Manya Silvas, MD 01/10/2018 11:02:50 AM This report has been signed electronically. Number of Addenda: 0 Note Initiated On: 01/10/2018 10:14 AM Scope Withdrawal Time: 0 hours 14 minutes 53 seconds  Total Procedure Duration: 0 hours 23 minutes 18 seconds       [facility]

## 2018-01-11 ENCOUNTER — Encounter: Payer: Self-pay | Admitting: Unknown Physician Specialty

## 2018-01-11 LAB — SURGICAL PATHOLOGY

## 2018-01-11 NOTE — Anesthesia Postprocedure Evaluation (Signed)
Anesthesia Post Note  Patient: Brian Huffman.  Procedure(s) Performed: COLONOSCOPY WITH PROPOFOL (N/A )  Patient location during evaluation: Endoscopy Anesthesia Type: General Level of consciousness: awake and alert Pain management: pain level controlled Vital Signs Assessment: post-procedure vital signs reviewed and stable Respiratory status: spontaneous breathing, nonlabored ventilation, respiratory function stable and patient connected to nasal cannula oxygen Cardiovascular status: blood pressure returned to baseline and stable Postop Assessment: no apparent nausea or vomiting Anesthetic complications: no     Last Vitals:  Vitals:   01/10/18 1120 01/10/18 1130  BP: 117/66 138/77  Pulse: 71 71  Resp: 16 17  Temp:    SpO2: 98% 97%    Last Pain:  Vitals:   01/11/18 0813  TempSrc:   PainSc: 0-No pain                 Martha Clan

## 2018-03-12 DIAGNOSIS — G500 Trigeminal neuralgia: Secondary | ICD-10-CM | POA: Diagnosis not present

## 2018-03-12 DIAGNOSIS — I159 Secondary hypertension, unspecified: Secondary | ICD-10-CM | POA: Diagnosis not present

## 2018-04-13 DIAGNOSIS — G500 Trigeminal neuralgia: Secondary | ICD-10-CM | POA: Diagnosis not present

## 2018-04-13 DIAGNOSIS — I10 Essential (primary) hypertension: Secondary | ICD-10-CM | POA: Diagnosis not present

## 2018-04-13 DIAGNOSIS — E790 Hyperuricemia without signs of inflammatory arthritis and tophaceous disease: Secondary | ICD-10-CM | POA: Diagnosis not present

## 2018-04-13 DIAGNOSIS — E7800 Pure hypercholesterolemia, unspecified: Secondary | ICD-10-CM | POA: Diagnosis not present

## 2018-04-13 DIAGNOSIS — I77810 Thoracic aortic ectasia: Secondary | ICD-10-CM | POA: Diagnosis not present

## 2018-04-13 DIAGNOSIS — I739 Peripheral vascular disease, unspecified: Secondary | ICD-10-CM | POA: Diagnosis not present

## 2018-04-13 DIAGNOSIS — K219 Gastro-esophageal reflux disease without esophagitis: Secondary | ICD-10-CM | POA: Diagnosis not present

## 2018-04-13 DIAGNOSIS — F5104 Psychophysiologic insomnia: Secondary | ICD-10-CM | POA: Diagnosis not present

## 2018-04-13 DIAGNOSIS — N400 Enlarged prostate without lower urinary tract symptoms: Secondary | ICD-10-CM | POA: Diagnosis not present

## 2018-04-28 DIAGNOSIS — J0100 Acute maxillary sinusitis, unspecified: Secondary | ICD-10-CM | POA: Diagnosis not present

## 2018-05-02 ENCOUNTER — Other Ambulatory Visit: Payer: Self-pay

## 2018-05-02 ENCOUNTER — Emergency Department
Admission: EM | Admit: 2018-05-02 | Discharge: 2018-05-02 | Disposition: A | Discharge status: Home / Self Care | Payer: PPO | Attending: Student in an Organized Health Care Education/Training Program | Admitting: Student in an Organized Health Care Education/Training Program

## 2018-05-02 ENCOUNTER — Emergency Department: Payer: PPO

## 2018-05-02 DIAGNOSIS — R0602 Shortness of breath: Secondary | ICD-10-CM | POA: Insufficient documentation

## 2018-05-02 DIAGNOSIS — Z79899 Other long term (current) drug therapy: Secondary | ICD-10-CM | POA: Diagnosis not present

## 2018-05-02 DIAGNOSIS — R1110 Vomiting, unspecified: Secondary | ICD-10-CM | POA: Insufficient documentation

## 2018-05-02 DIAGNOSIS — I10 Essential (primary) hypertension: Secondary | ICD-10-CM | POA: Diagnosis not present

## 2018-05-02 DIAGNOSIS — Z7902 Long term (current) use of antithrombotics/antiplatelets: Secondary | ICD-10-CM | POA: Insufficient documentation

## 2018-05-02 DIAGNOSIS — E039 Hypothyroidism, unspecified: Secondary | ICD-10-CM | POA: Insufficient documentation

## 2018-05-02 DIAGNOSIS — R05 Cough: Secondary | ICD-10-CM | POA: Diagnosis not present

## 2018-05-02 DIAGNOSIS — J40 Bronchitis, not specified as acute or chronic: Secondary | ICD-10-CM | POA: Diagnosis not present

## 2018-05-02 DIAGNOSIS — R079 Chest pain, unspecified: Secondary | ICD-10-CM | POA: Diagnosis not present

## 2018-05-02 LAB — CBC
HCT: 43.5 % (ref 39.0–52.0)
HEMOGLOBIN: 15.2 g/dL (ref 13.0–17.0)
MCH: 32.3 pg (ref 26.0–34.0)
MCHC: 34.9 g/dL (ref 30.0–36.0)
MCV: 92.4 fL (ref 80.0–100.0)
Platelets: 129 10*3/uL — ABNORMAL LOW (ref 150–400)
RBC: 4.71 MIL/uL (ref 4.22–5.81)
RDW: 12.2 % (ref 11.5–15.5)
WBC: 7.7 10*3/uL (ref 4.0–10.5)
nRBC: 0 % (ref 0.0–0.2)

## 2018-05-02 LAB — BASIC METABOLIC PANEL
ANION GAP: 11 (ref 5–15)
BUN: 19 mg/dL (ref 8–23)
CO2: 17 mmol/L — ABNORMAL LOW (ref 22–32)
Calcium: 9.2 mg/dL (ref 8.9–10.3)
Chloride: 107 mmol/L (ref 98–111)
Creatinine, Ser: 1.02 mg/dL (ref 0.61–1.24)
GFR calc Af Amer: >60 mL/min (ref >60)
GLUCOSE: 130 mg/dL — AB (ref 70–99)
POTASSIUM: 3.8 mmol/L (ref 3.5–5.1)
Sodium: 135 mmol/L (ref 135–145)

## 2018-05-02 LAB — TROPONIN I

## 2018-05-02 MED ORDER — PREDNISONE 20 MG PO TABS: 40.0000 mg | ORAL_TABLET | Freq: Every day | ORAL | 0 refills | Status: AC

## 2018-05-02 MED ORDER — AEROCHAMBER MV MISC: 0 refills | Status: AC

## 2018-05-02 MED ORDER — IPRATROPIUM-ALBUTEROL 0.5-2.5 (3) MG/3ML IN SOLN
3.0000 mL | Freq: Once | RESPIRATORY_TRACT | Status: AC
  Administered 2018-05-02: 3 mL via RESPIRATORY_TRACT
  Filled 2018-05-02: qty 3

## 2018-05-02 MED ORDER — HYDROCOD POLST-CPM POLST ER 10-8 MG/5ML PO SUER: 5.0000 mL | Freq: Every evening | ORAL | 0 refills | Status: DC | PRN

## 2018-05-02 MED ORDER — SODIUM CHLORIDE 0.9% FLUSH: 3.0000 mL | Freq: Once | INTRAVENOUS | Status: DC

## 2018-05-02 MED ORDER — HYDROCOD POLST-CPM POLST ER 10-8 MG/5ML PO SUER
5.0000 mL | Freq: Once | ORAL | Status: AC
  Administered 2018-05-02: 5 mL via ORAL
  Filled 2018-05-02: qty 5

## 2018-05-02 NOTE — ED Triage Notes (Addendum)
Pt comes via POV from home with c/o SHOB. Pt states this started couple days ago. Pt states productive cough. Pt has labored breathing and accessory muscle usage.   Pt states some vomiting also.

## 2018-05-02 NOTE — ED Notes (Signed)
Pt states mild improvement after breathing treatment.

## 2018-05-02 NOTE — ED Provider Notes (Signed)
[facility] Emergency Department Provider Note    First MD Initiated Contact with Patient 05/02/18 1621     (approximate)  I have reviewed the triage vital signs and the nursing notes.   HISTORY  Chief Complaint Shortness of Breath    HPI Brian Ron. is a 73 y.o. male below listed past medical history reported history of bronchitis presents the ER with 2 weeks of progressively worsening cough and shortness of breath.  States his primary complaint today is just persistent cough.  States he is having episodes of posttussive emesis.  Denies any measured fevers.  Was seen at walk-in clinic over the weekend and started on doxycycline but is started to feel more nauseated since starting this medication.  Was also given Ladona Ridgel which are not helping.  Denies any chest pain.  No orthopnea.  No diarrhea.  No dysuria.    Past Medical History:  Diagnosis Date  . Allergic genetic state   . Family hx of colon cancer   . Gout   . HLD (hyperlipidemia)   . Hypertension   . Hypothyroidism   . Obesity   . Trigeminal neuralgia   . Trigeminal neuralgia   . Vertigo    Family History  Problem Relation Age of Onset  . Stroke Mother   . Diabetes Mellitus II Father   . Diabetes Mellitus II Sister    Past Surgical History:  Procedure Laterality Date  . COLONOSCOPY W/ POLYPECTOMY  2002, 2003, 2007, 2010, 2015.   adenomatous polyps  . COLONOSCOPY WITH PROPOFOL N/A 01/10/2018   Procedure: COLONOSCOPY WITH PROPOFOL;  Surgeon: Manya Silvas, MD;  Location: [facility] ENDOSCOPY;  Service: Endoscopy;  Laterality: N/A;   Patient Active Problem List   Diagnosis Date Noted  . Respiratory distress 12/27/2016  . Bronchitis 12/27/2016  . Reactive airway disease 12/27/2016  . Diastolic dysfunction 92/02/9416  . Essential hypertension 12/27/2016  . Community acquired pneumonia 11/05/2016      Prior to Admission medications   Medication Sig Start Date End  Date Taking? Authorizing Provider  albuterol (PROVENTIL HFA;VENTOLIN HFA) 108 (90 Base) MCG/ACT inhaler Inhale 2 puffs into the lungs every 6 (six) hours as needed for wheezing or shortness of breath. 11/06/16   Epifanio Lesches, MD  allopurinol (ZYLOPRIM) 100 MG tablet Take 1 tablet by mouth daily. 09/18/16   [provider]  amLODipine (NORVASC) 5 MG tablet Take 5 mg by mouth daily.    [provider]  atorvastatin (LIPITOR) 10 MG tablet Take 10 mg by mouth daily.    [provider]  azelastine (ASTELIN) 0.1 % nasal spray Place 2 sprays into both nostrils 2 (two) times daily. Use in each nostril as directed    [provider]  benzonatate (TESSALON) 100 MG capsule Take 100 mg by mouth 3 (three) times daily as needed for cough.    [provider]  candesartan (ATACAND) 8 MG tablet Take 1 tablet by mouth 2 (two) times daily. 10/27/16   [provider]  carbamazepine (TEGRETOL XR) 100 MG 12 hr tablet Take 100 mg by mouth 4 (four) times daily.    [provider]  cetirizine (ZYRTEC) 10 MG tablet Take 10 mg by mouth daily.    [provider]  chlorpheniramine-HYDROcodone (TUSSIONEX PENNKINETIC ER) 10-8 MG/5ML SUER Take 5 mLs by mouth at bedtime as needed for cough. 05/02/18   Merlyn Lot, MD  ezetimibe (ZETIA) 10 MG tablet Take 5 mg by mouth daily.  [provider]  fluticasone (FLONASE) 50 MCG/ACT nasal spray Place 2 sprays into both nostrils daily.    [provider]  gabapentin (NEURONTIN) 100 MG capsule Take 100 mg by mouth 3 (three) times daily.    [provider]  guaiFENesin-codeine 100-10 MG/5ML syrup Take 10 mLs by mouth 3 (three) times daily as needed for cough. 12/28/16   Vaughan Basta, MD  levothyroxine (SYNTHROID, LEVOTHROID) 175 MCG tablet Take 175 mcg by mouth daily before breakfast.    [provider]  memantine (NAMENDA) 5 MG tablet Take 5 mg by mouth 2 (two) times  daily.    [provider]  Multiple Vitamin (MULTIVITAMIN) tablet Take 1 tablet by mouth daily.    [provider]  omeprazole (PRILOSEC) 20 MG capsule Take 20 mg by mouth 2 (two) times daily before a meal.    [provider]  ondansetron (ZOFRAN-ODT) 4 MG disintegrating tablet Take 4 mg by mouth every 8 (eight) hours as needed for nausea or vomiting.    [provider]  pravastatin (PRAVACHOL) 10 MG tablet Take 10 mg by mouth daily.    [provider]  predniSONE (DELTASONE) 20 MG tablet 3 tabs po once day 1, then 2 tabs po qd x 2 days, then 1 tab po qd x 2 days, then half a tab po qd x 2 days Patient not taking: Reported on 01/10/2018 04/21/17   Norval Gable, MD  predniSONE (DELTASONE) 20 MG tablet Take 2 tablets (40 mg total) by mouth daily for 5 days. 05/02/18 05/07/18  Merlyn Lot, MD  promethazine (PHENERGAN) 25 MG tablet Take 25 mg by mouth every 6 (six) hours as needed for nausea or vomiting.    [provider]  ranitidine (ZANTAC) 300 MG tablet Take 300 mg by mouth 3 times/day as needed-between meals & bedtime.     [provider]  Spacer/Aero-Holding Chambers (AEROCHAMBER MV) inhaler Use as instructed 05/02/18   Merlyn Lot, MD  tamsulosin (FLOMAX) 0.4 MG CAPS capsule Take 0.4 mg by mouth daily.    [provider]  tiotropium (SPIRIVA) 18 MCG inhalation capsule Place 1 capsule (18 mcg total) into inhaler and inhale daily. 12/28/16   Vaughan Basta, MD  topiramate (TOPAMAX) 25 MG tablet Take 25 mg by mouth 2 (two) times daily.    [provider]  traZODone (DESYREL) 50 MG tablet Take 50 mg by mouth at bedtime.    [provider]  valsartan (DIOVAN) 320 MG tablet Take 320 mg by mouth daily.    [provider]    Allergies [place] grass extract; Other; Statins; Penicillins; and Shellfish-derived products    Social History Social History   Tobacco Use  . Smoking status:  Never Smoker  . Smokeless tobacco: Never Used  Substance Use Topics  . Alcohol use: No  . Drug use: No    Review of Systems Patient denies headaches, rhinorrhea, blurry vision, numbness, shortness of breath, chest pain, edema, cough, abdominal pain, nausea, vomiting, diarrhea, dysuria, fevers, rashes or hallucinations unless otherwise stated above in HPI. ____________________________________________   PHYSICAL EXAM:  VITAL SIGNS: Vitals:   05/02/18 1547 05/02/18 1700  BP: 126/67 112/71  Pulse: 92 95  Resp: (!) 23 20  Temp: 98.2 F (36.8 C)   SpO2: 96% 97%    Constitutional: Alert and oriented.  Eyes: Conjunctivae are normal.  Head: Atraumatic. Nose: No congestion/rhinnorhea. Mouth/Throat: Mucous membranes are moist.   Neck: No stridor. Painless ROM.  Cardiovascular: Normal rate, regular  rhythm. Grossly normal heart sounds.  Good peripheral circulation. Respiratory: Normal respiratory effort.  No retractions. Lungs with coarse expiratory wheeze, no rhonchi Gastrointestinal: Soft and nontender. No distention. No abdominal bruits. No CVA tenderness. Genitourinary:  Musculoskeletal: No lower extremity tenderness nor edema.  No joint effusions. Neurologic:  Normal speech and language. No gross focal neurologic deficits are appreciated. No facial droop Skin:  Skin is warm, dry and intact. No rash noted. Psychiatric: Mood and affect are normal. Speech and behavior are normal.  ____________________________________________   LABS (all labs ordered are listed, but only abnormal results are displayed)  Results for orders placed or performed during the hospital encounter of 05/02/18 (from the past 24 hour(s))  Basic metabolic panel     Status: Abnormal   Collection Time: 05/02/18  3:49 PM  Result Value Ref Range   Sodium 135 135 - 145 mmol/L   Potassium 3.8 3.5 - 5.1 mmol/L   Chloride 107 98 - 111 mmol/L   CO2 17 (L) 22 - 32 mmol/L   Glucose, Bld 130 (H) 70 - 99 mg/dL   BUN  19 8 - 23 mg/dL   Creatinine, Ser 1.02 0.61 - 1.24 mg/dL   Calcium 9.2 8.9 - 10.3 mg/dL   GFR calc non Af Amer >60 >60 mL/min   GFR calc Af Amer >60 >60 mL/min   Anion gap 11 5 - 15  CBC     Status: Abnormal   Collection Time: 05/02/18  3:49 PM  Result Value Ref Range   WBC 7.7 4.0 - 10.5 K/uL   RBC 4.71 4.22 - 5.81 MIL/uL   Hemoglobin 15.2 13.0 - 17.0 g/dL   HCT 43.5 39.0 - 52.0 %   MCV 92.4 80.0 - 100.0 fL   MCH 32.3 26.0 - 34.0 pg   MCHC 34.9 30.0 - 36.0 g/dL   RDW 12.2 11.5 - 15.5 %   Platelets 129 (L) 150 - 400 K/uL   nRBC 0.0 0.0 - 0.2 %  Troponin I - ONCE - STAT     Status: None   Collection Time: 05/02/18  3:49 PM  Result Value Ref Range   Troponin I <0.03 <0.03 ng/mL   ____________________________________________  EKG My review and personal interpretation at Time: 15:46   Indication: cough  Rate: 100  Rhythm: sinus Axis: left Other: lbbb, nonspecific st abn ____________________________________________  RADIOLOGY  I personally reviewed all radiographic images ordered to evaluate for the above acute complaints and reviewed radiology reports and findings.  These findings were personally discussed with the patient.  Please see medical record for radiology report.  ____________________________________________   PROCEDURES  Procedure(s) performed:  Procedures    Critical Care performed: no ____________________________________________   INITIAL IMPRESSION / ASSESSMENT AND PLAN / ED COURSE  Pertinent labs & imaging results that were available during my care of the patient were reviewed by me and considered in my medical decision making (see chart for details).   DDX: pna, bronchtiis, copd, chf, edema  Brian Morain. is a 73 y.o. who presents to the ED with symptoms as described above.  Patient nontoxic afebrile with no acute hypoxia.  Does have expiratory wheeze on exam and very bronchitic sounding cough.  Will give nebulizer.  Blood work reassuring.   Will give antitussive and reassess.  Clinical Course as of May 02 1849  Wed May 02, 2018  1847 Patient reassessed.  Had significant improvement after nebulizer treatment.  Will give burst of steroid as well as a  spacer for his rescue inhaler and steroids for bronchitis.  Does not seem consistent with heart failure.  No evidence of ACS.  Not consistent with PE.  Patient otherwise stable and appropriate for outpatient follow-up.   [PR]    Clinical Course User Index [PR] Merlyn Lot, MD     As part of my medical decision making, I reviewed the following data within the [facility] notes reviewed and incorporated, Labs reviewed, notes from prior ED visits .  ____________________________________________   FINAL CLINICAL IMPRESSION(S) / ED DIAGNOSES  Final diagnoses:  Bronchitis      NEW MEDICATIONS STARTED DURING THIS VISIT:  New Prescriptions   CHLORPHENIRAMINE-HYDROCODONE (TUSSIONEX PENNKINETIC ER) 10-8 MG/5ML SUER    Take 5 mLs by mouth at bedtime as needed for cough.   PREDNISONE (DELTASONE) 20 MG TABLET    Take 2 tablets (40 mg total) by mouth daily for 5 days.   SPACER/AERO-HOLDING CHAMBERS (AEROCHAMBER MV) INHALER    Use as instructed     Note:  This document was prepared using Dragon voice recognition software and may include unintentional dictation errors.    Merlyn Lot, MD 05/02/18 [phone]

## 2018-05-08 IMAGING — CR DG CHEST 2V
2 series · 2 of 2 positions shown · non-contrast
Comparison: 04/15/2014

CLINICAL DATA: Cough and malaise for the past 2 weeks.

EXAM:
CHEST  2 VIEW

[chest pa]
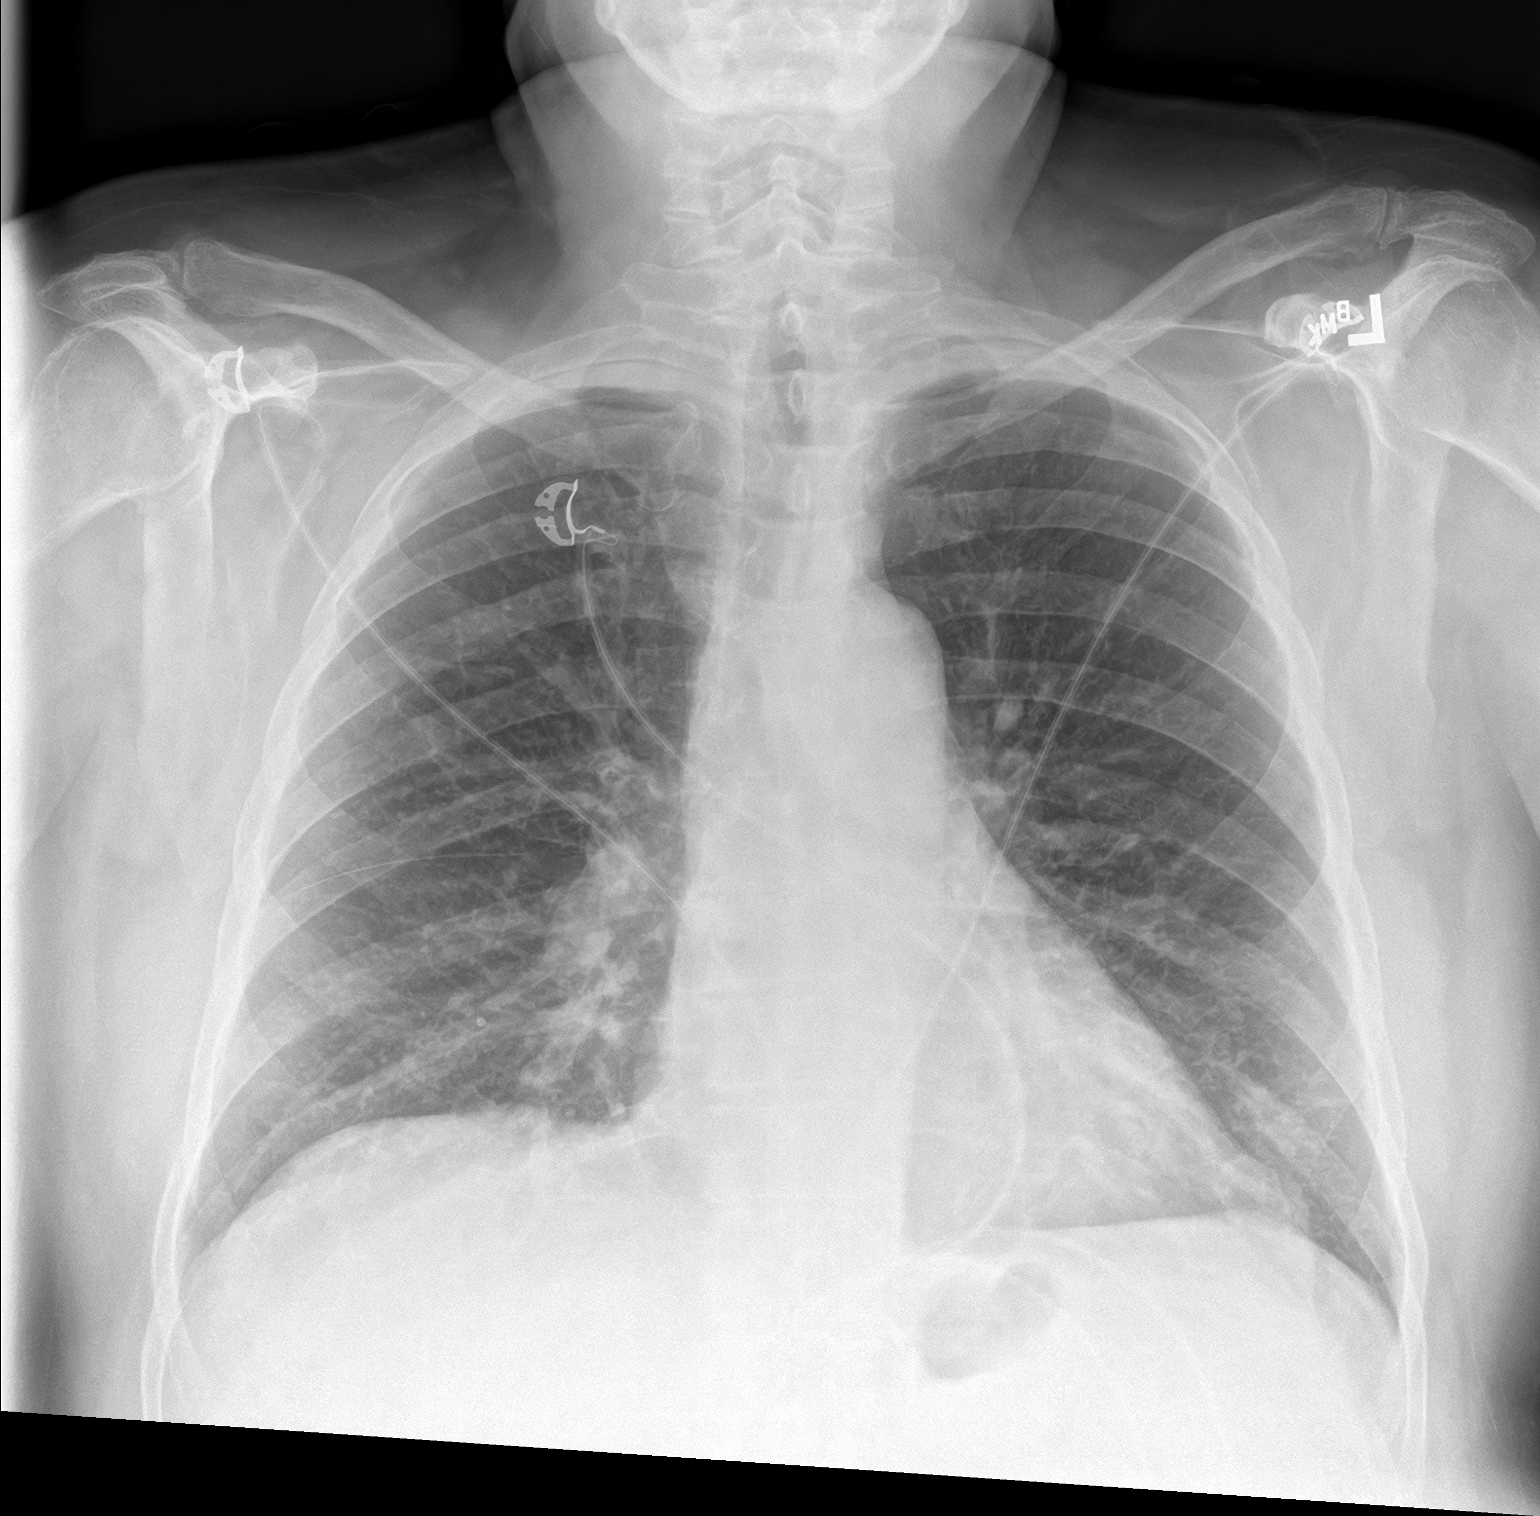

[chest lat]
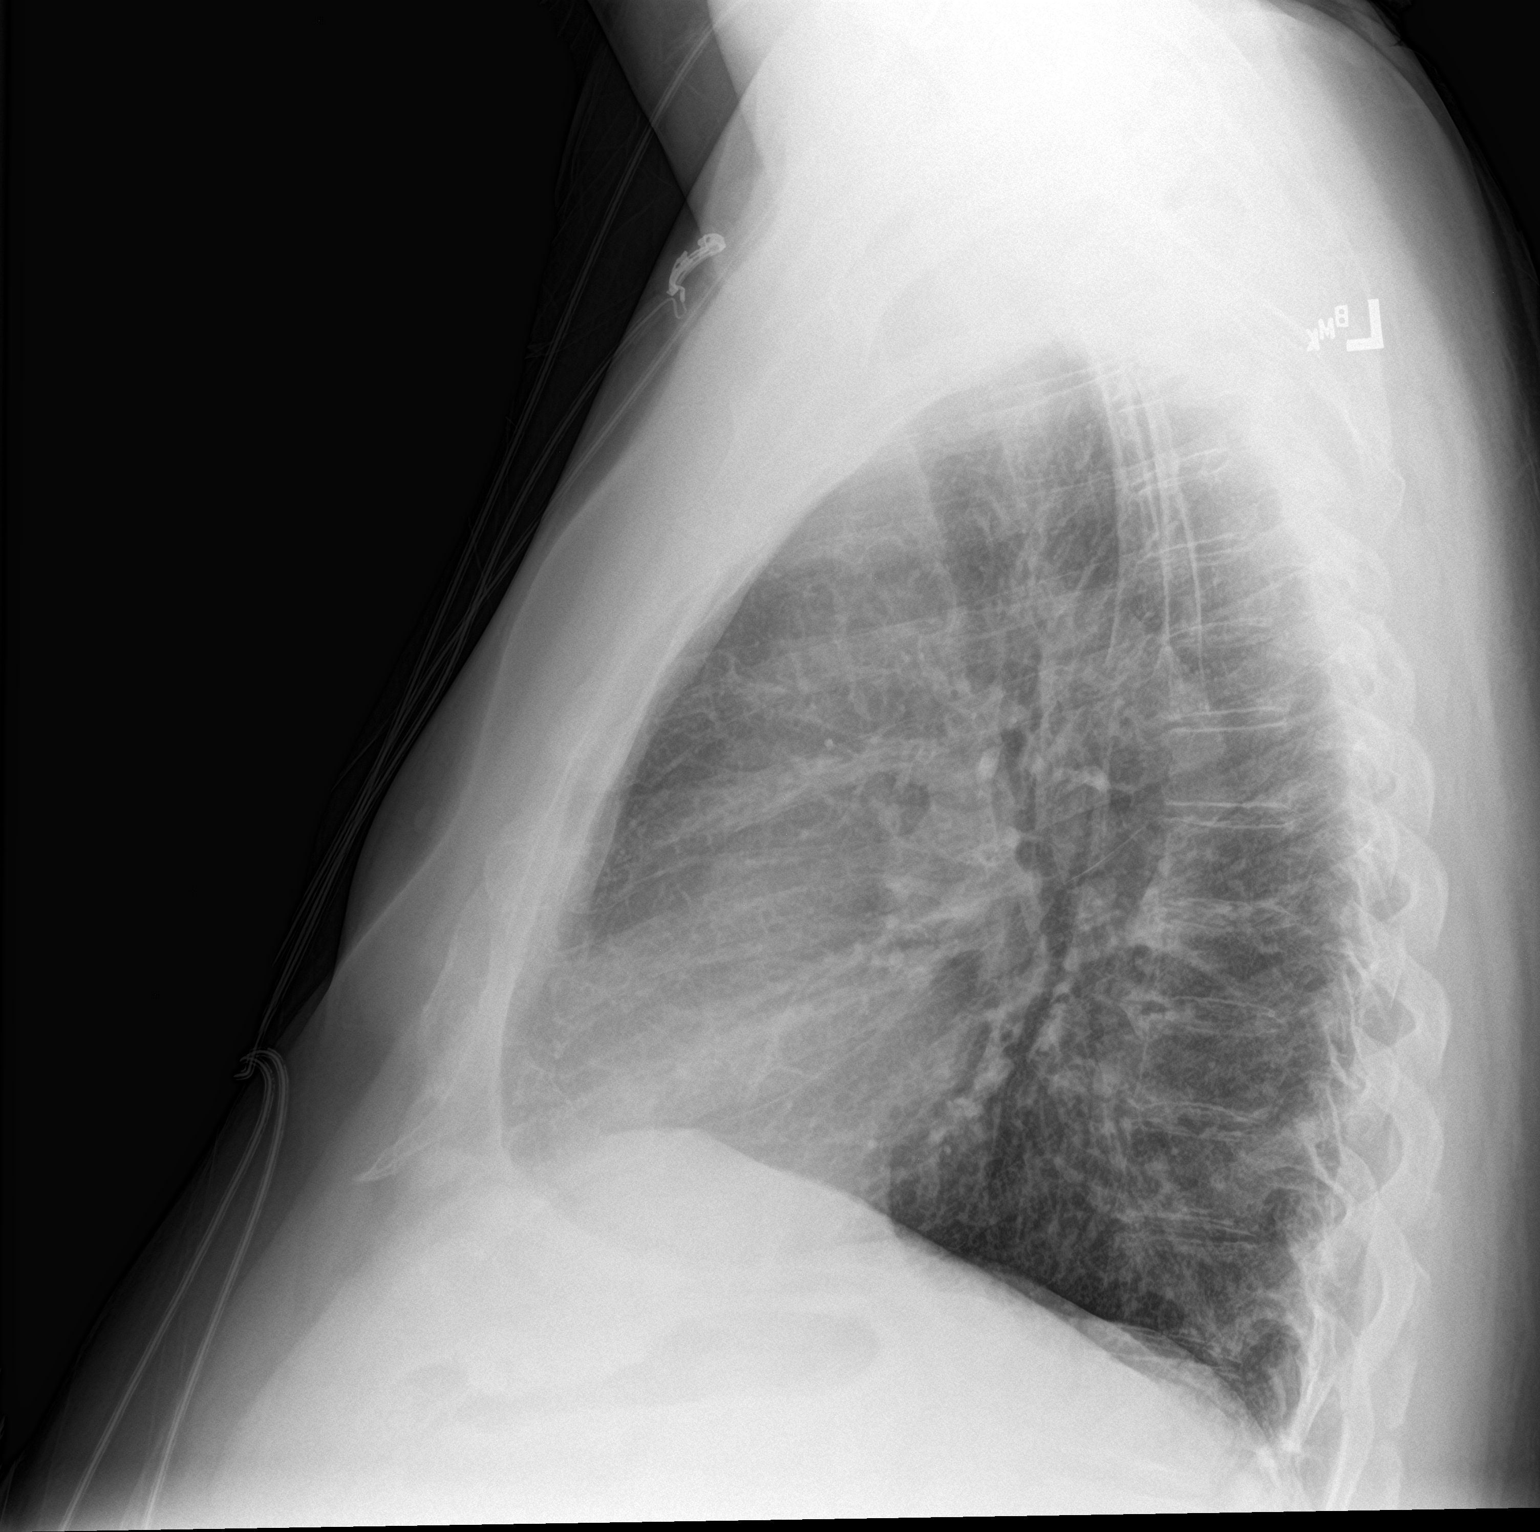

[2 of 2 positions shown; findings below may reference images not displayed]

FINDINGS: The heart size and mediastinal contours are within normal limits.
Patchy bibasilar opacities are noted suspicious for atelectasis
and/or minimal pneumonia. No effusion or pneumothorax. No acute nor
suspicious osseous abnormality.
IMPRESSION: Subtle bibasilar airspace opacities suspicious for atelectasis
and/or minimal pneumonia.

## 2018-05-24 DIAGNOSIS — I77810 Thoracic aortic ectasia: Secondary | ICD-10-CM | POA: Diagnosis not present

## 2018-06-06 DIAGNOSIS — Z125 Encounter for screening for malignant neoplasm of prostate: Secondary | ICD-10-CM | POA: Diagnosis not present

## 2018-06-06 DIAGNOSIS — G932 Benign intracranial hypertension: Secondary | ICD-10-CM | POA: Diagnosis not present

## 2018-06-06 DIAGNOSIS — G500 Trigeminal neuralgia: Secondary | ICD-10-CM | POA: Diagnosis not present

## 2018-06-06 DIAGNOSIS — I10 Essential (primary) hypertension: Secondary | ICD-10-CM | POA: Diagnosis not present

## 2018-06-06 DIAGNOSIS — E7800 Pure hypercholesterolemia, unspecified: Secondary | ICD-10-CM | POA: Diagnosis not present

## 2018-06-06 DIAGNOSIS — B9689 Other specified bacterial agents as the cause of diseases classified elsewhere: Secondary | ICD-10-CM | POA: Diagnosis not present

## 2018-06-06 DIAGNOSIS — J208 Acute bronchitis due to other specified organisms: Secondary | ICD-10-CM | POA: Diagnosis not present

## 2018-06-06 DIAGNOSIS — E039 Hypothyroidism, unspecified: Secondary | ICD-10-CM | POA: Diagnosis not present

## 2018-06-06 DIAGNOSIS — G5711 Meralgia paresthetica, right lower limb: Secondary | ICD-10-CM | POA: Diagnosis not present

## 2018-06-06 DIAGNOSIS — I77810 Thoracic aortic ectasia: Secondary | ICD-10-CM | POA: Diagnosis not present

## 2018-06-19 IMAGING — CR DG CHEST 2V
1 series · 2 of 2 positions shown · non-contrast
Comparison: 11/04/2016

CLINICAL DATA: Recent pneumonia 4 weeks ago.

EXAM:
CHEST  2 VIEW

[Series 1: dg chest 2 view · 0.14mm/px · 2 of 2 slices shown]
[im 1/2]
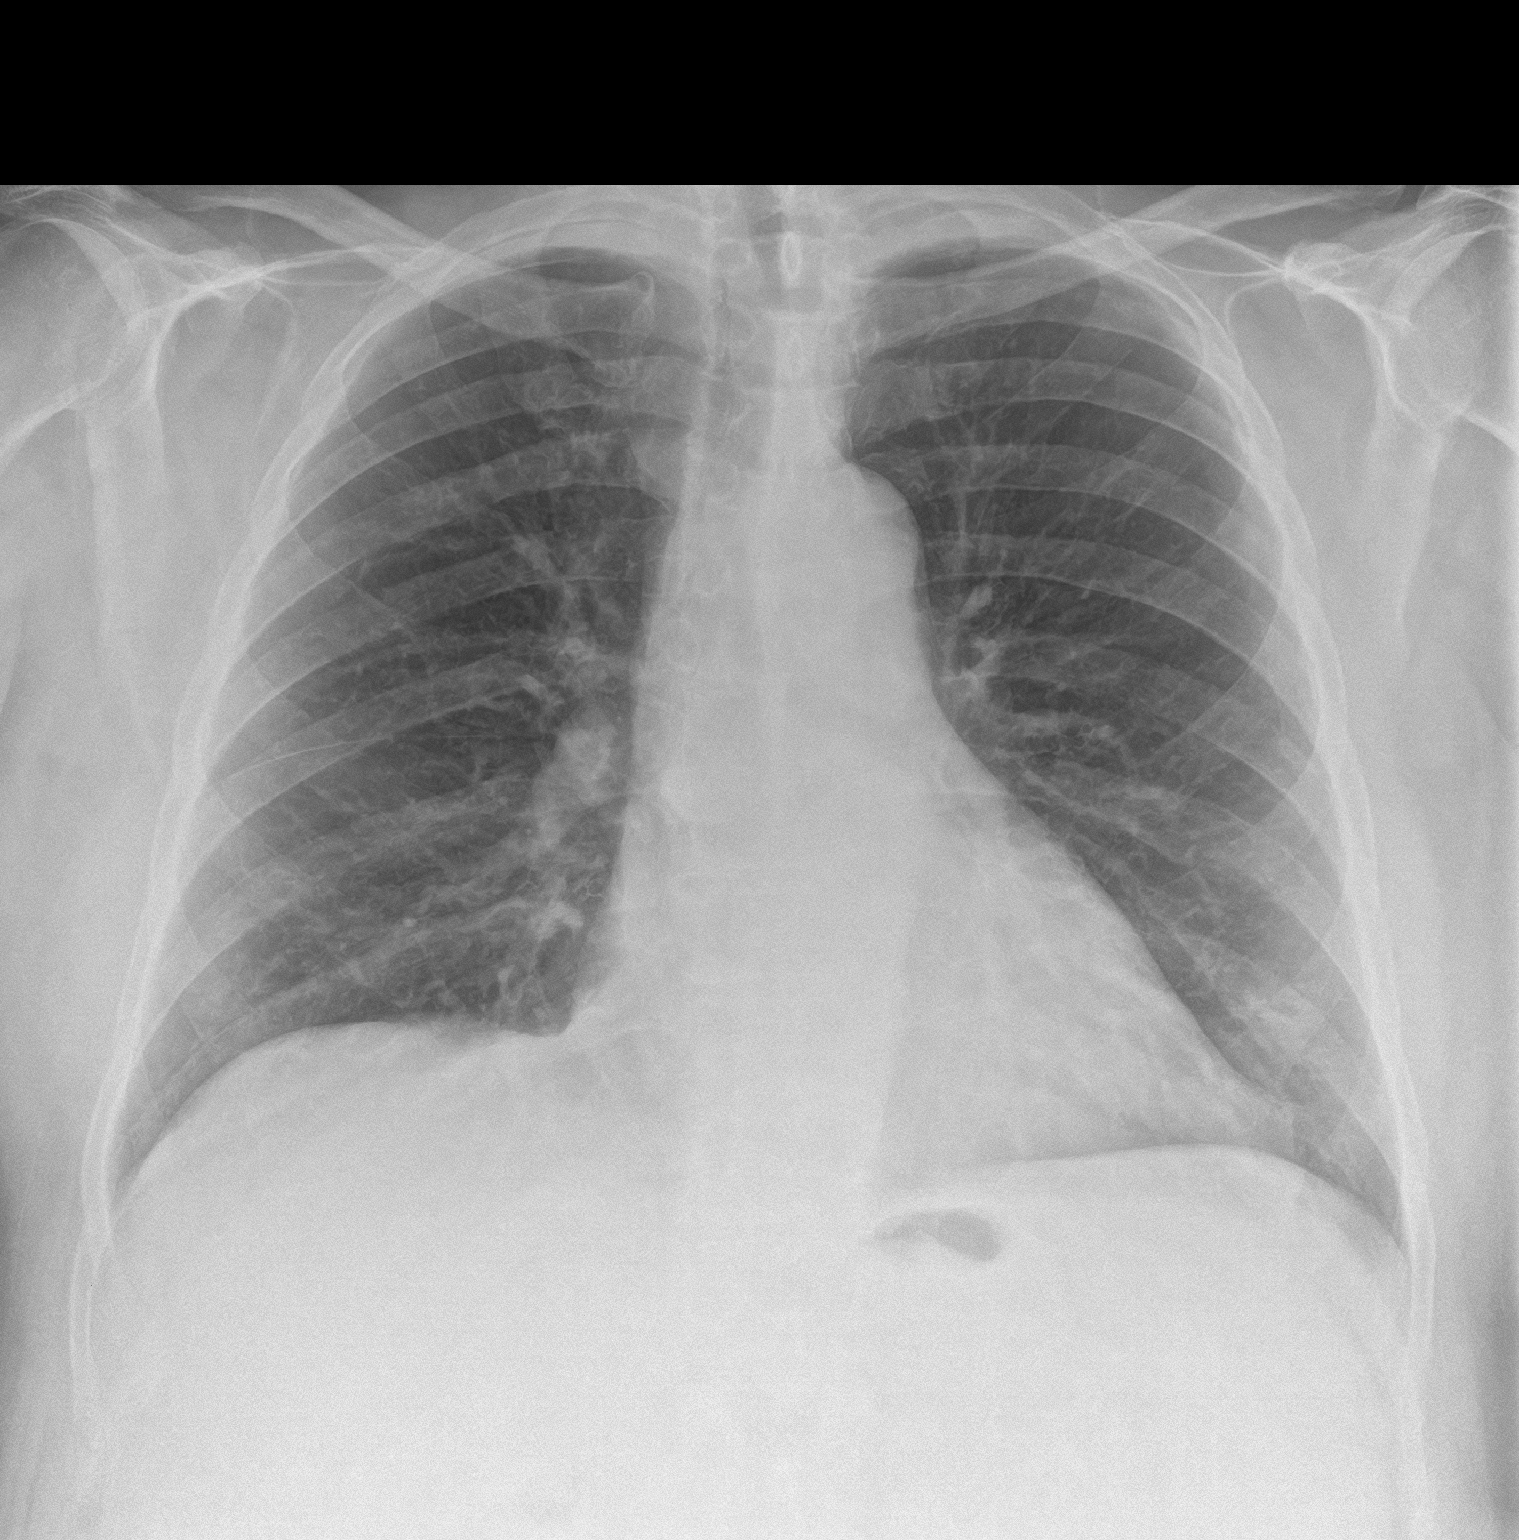
[im 2/2]
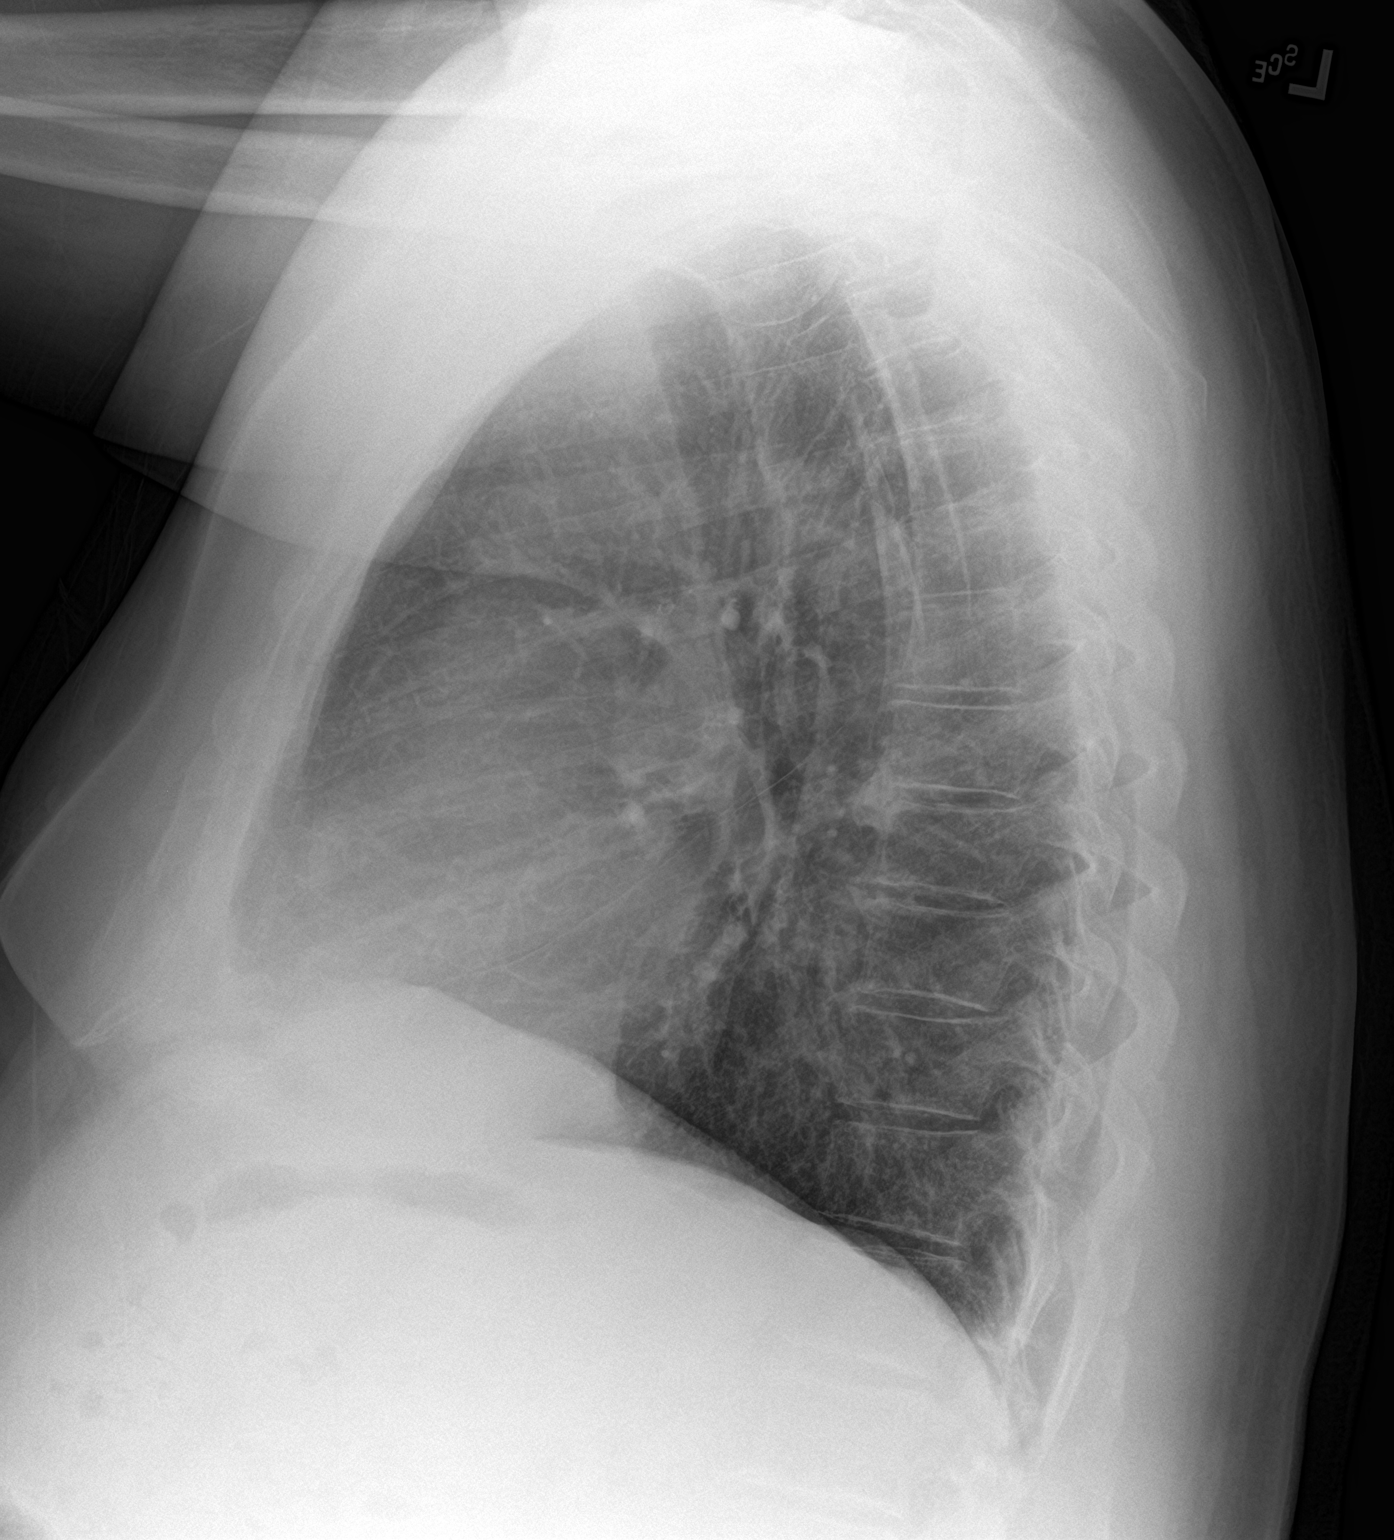

[2 of 2 positions shown; findings below may reference images not displayed]

FINDINGS: The heart size and mediastinal contours are within normal limits.
Both lungs are clear. The visualized skeletal structures are
unremarkable.
IMPRESSION: No active cardiopulmonary disease.

## 2018-06-30 IMAGING — DX DG CHEST 1V PORT
1 series · 1 of 1 positions shown · non-contrast
Comparison: Chest radiograph December 16, 2016

CLINICAL DATA: Pneumonia. Shortness of breath. History of
hypertension.

EXAM:
PORTABLE CHEST 1 VIEW

[chest ap]
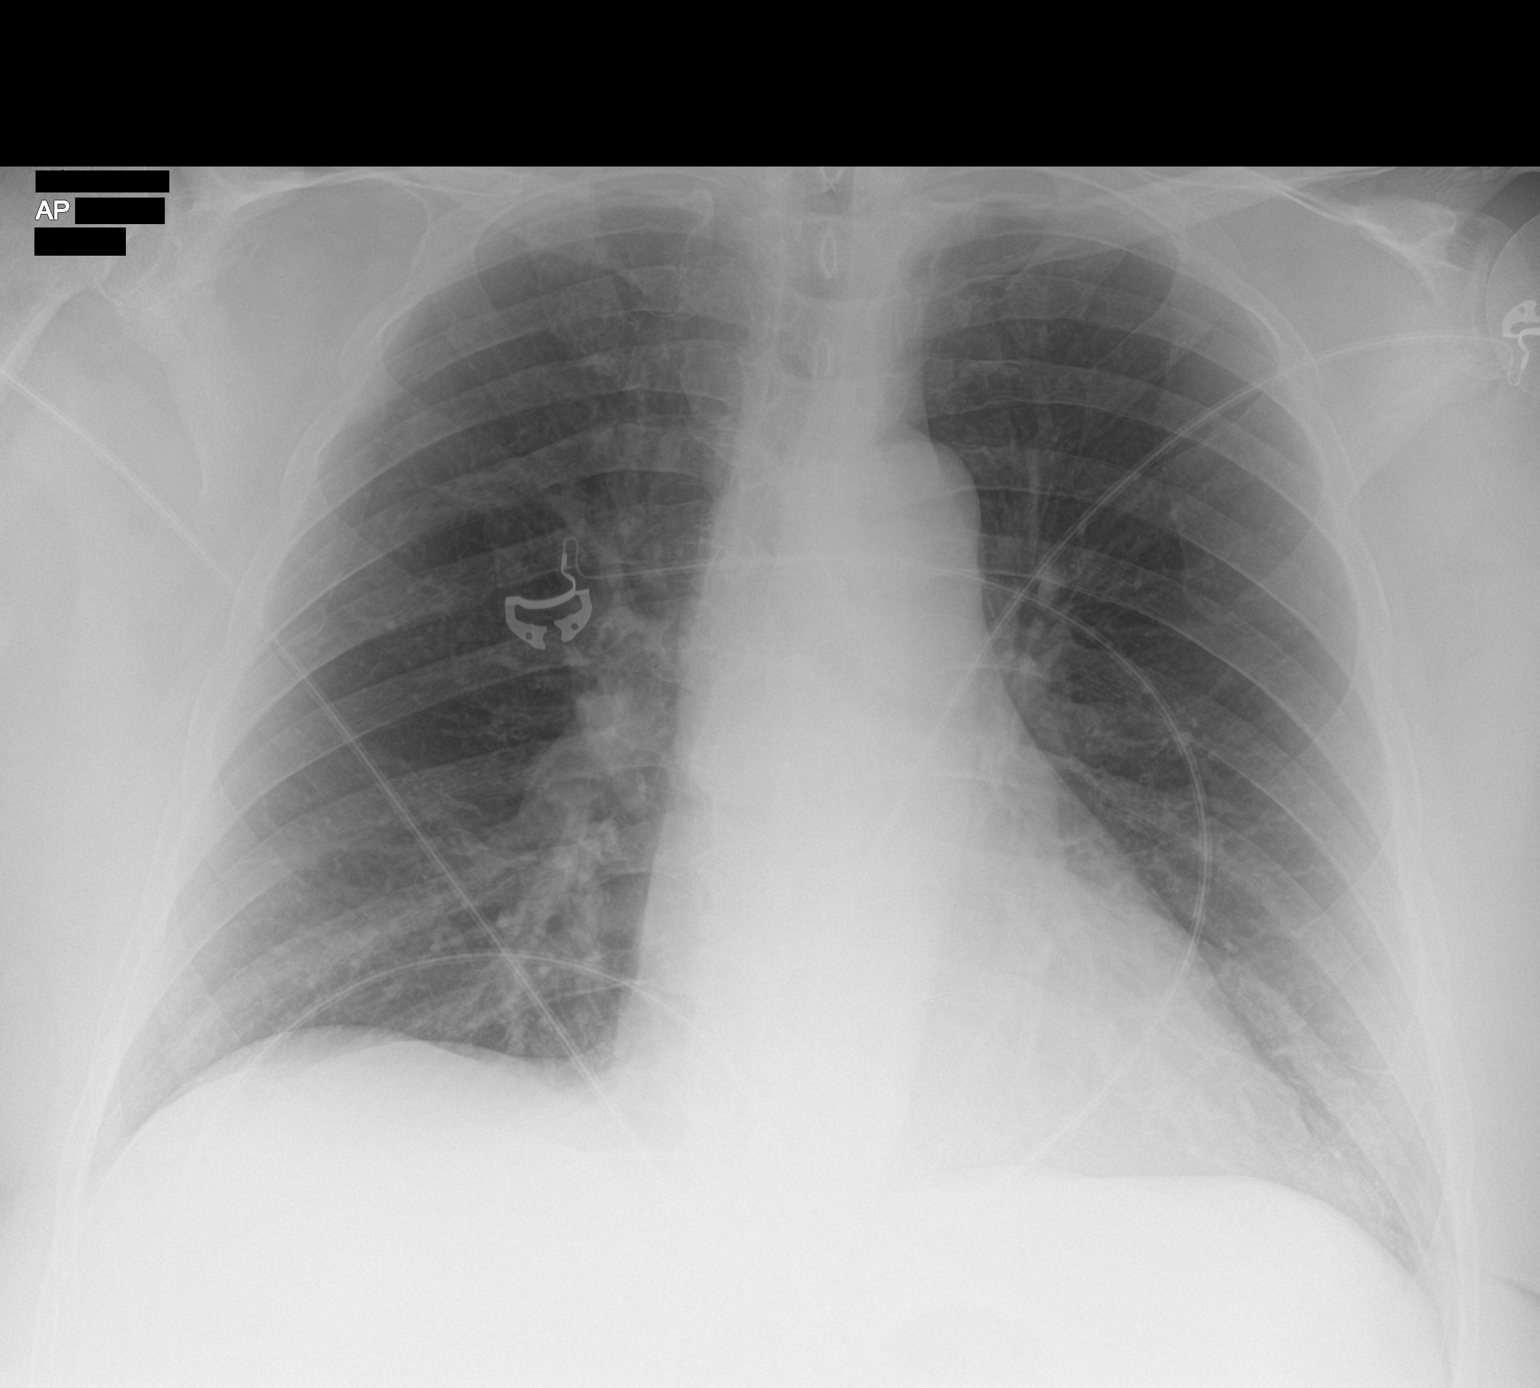

[1 of 1 positions shown; findings below may reference images not displayed]

FINDINGS: The heart size and mediastinal contours are within normal limits.
Both lungs are clear. Minimal LEFT lung base scarring. The
visualized skeletal structures are unremarkable.
IMPRESSION: No acute cardiopulmonary process.

## 2018-07-05 DIAGNOSIS — R0609 Other forms of dyspnea: Secondary | ICD-10-CM | POA: Diagnosis not present

## 2018-07-05 DIAGNOSIS — I77810 Thoracic aortic ectasia: Secondary | ICD-10-CM | POA: Diagnosis not present

## 2018-07-05 DIAGNOSIS — H8112 Benign paroxysmal vertigo, left ear: Secondary | ICD-10-CM | POA: Diagnosis not present

## 2018-07-05 DIAGNOSIS — E7800 Pure hypercholesterolemia, unspecified: Secondary | ICD-10-CM | POA: Diagnosis not present

## 2018-07-05 DIAGNOSIS — I10 Essential (primary) hypertension: Secondary | ICD-10-CM | POA: Diagnosis not present

## 2018-07-13 DIAGNOSIS — I159 Secondary hypertension, unspecified: Secondary | ICD-10-CM | POA: Diagnosis not present

## 2018-07-13 DIAGNOSIS — I10 Essential (primary) hypertension: Secondary | ICD-10-CM | POA: Diagnosis not present

## 2018-07-13 DIAGNOSIS — E7800 Pure hypercholesterolemia, unspecified: Secondary | ICD-10-CM | POA: Diagnosis not present

## 2018-07-13 DIAGNOSIS — I77810 Thoracic aortic ectasia: Secondary | ICD-10-CM | POA: Diagnosis not present

## 2018-09-03 DIAGNOSIS — G500 Trigeminal neuralgia: Secondary | ICD-10-CM | POA: Diagnosis not present

## 2018-09-12 DIAGNOSIS — E7800 Pure hypercholesterolemia, unspecified: Secondary | ICD-10-CM | POA: Diagnosis not present

## 2018-09-12 DIAGNOSIS — G5711 Meralgia paresthetica, right lower limb: Secondary | ICD-10-CM | POA: Diagnosis not present

## 2018-09-12 DIAGNOSIS — I10 Essential (primary) hypertension: Secondary | ICD-10-CM | POA: Diagnosis not present

## 2018-09-12 DIAGNOSIS — I739 Peripheral vascular disease, unspecified: Secondary | ICD-10-CM | POA: Diagnosis not present

## 2018-09-12 DIAGNOSIS — I77810 Thoracic aortic ectasia: Secondary | ICD-10-CM | POA: Diagnosis not present

## 2018-09-12 DIAGNOSIS — I499 Cardiac arrhythmia, unspecified: Secondary | ICD-10-CM | POA: Diagnosis not present

## 2018-09-12 DIAGNOSIS — R0600 Dyspnea, unspecified: Secondary | ICD-10-CM | POA: Diagnosis not present

## 2018-09-28 DIAGNOSIS — I739 Peripheral vascular disease, unspecified: Secondary | ICD-10-CM | POA: Diagnosis not present

## 2018-12-26 DIAGNOSIS — Z01810 Encounter for preprocedural cardiovascular examination: Secondary | ICD-10-CM | POA: Diagnosis not present

## 2018-12-26 DIAGNOSIS — Z20828 Contact with and (suspected) exposure to other viral communicable diseases: Secondary | ICD-10-CM | POA: Diagnosis not present

## 2018-12-26 DIAGNOSIS — G500 Trigeminal neuralgia: Secondary | ICD-10-CM | POA: Diagnosis not present

## 2018-12-26 DIAGNOSIS — J80 Acute respiratory distress syndrome: Secondary | ICD-10-CM | POA: Diagnosis not present

## 2018-12-26 DIAGNOSIS — E785 Hyperlipidemia, unspecified: Secondary | ICD-10-CM | POA: Diagnosis not present

## 2018-12-26 DIAGNOSIS — I255 Ischemic cardiomyopathy: Secondary | ICD-10-CM | POA: Diagnosis not present

## 2018-12-26 DIAGNOSIS — R0600 Dyspnea, unspecified: Secondary | ICD-10-CM | POA: Diagnosis not present

## 2018-12-26 DIAGNOSIS — E039 Hypothyroidism, unspecified: Secondary | ICD-10-CM | POA: Diagnosis not present

## 2018-12-26 DIAGNOSIS — I2510 Atherosclerotic heart disease of native coronary artery without angina pectoris: Secondary | ICD-10-CM | POA: Diagnosis not present

## 2018-12-26 DIAGNOSIS — M109 Gout, unspecified: Secondary | ICD-10-CM | POA: Diagnosis not present

## 2018-12-26 DIAGNOSIS — F40240 Claustrophobia: Secondary | ICD-10-CM | POA: Diagnosis not present

## 2018-12-26 DIAGNOSIS — I447 Left bundle-branch block, unspecified: Secondary | ICD-10-CM | POA: Diagnosis not present

## 2018-12-26 DIAGNOSIS — Z79899 Other long term (current) drug therapy: Secondary | ICD-10-CM | POA: Diagnosis not present

## 2018-12-26 DIAGNOSIS — R001 Bradycardia, unspecified: Secondary | ICD-10-CM | POA: Diagnosis not present

## 2018-12-26 DIAGNOSIS — K219 Gastro-esophageal reflux disease without esophagitis: Secondary | ICD-10-CM | POA: Diagnosis not present

## 2018-12-26 DIAGNOSIS — I77810 Thoracic aortic ectasia: Secondary | ICD-10-CM | POA: Diagnosis not present

## 2018-12-26 DIAGNOSIS — I441 Atrioventricular block, second degree: Secondary | ICD-10-CM | POA: Diagnosis not present

## 2018-12-26 DIAGNOSIS — I10 Essential (primary) hypertension: Secondary | ICD-10-CM | POA: Diagnosis not present

## 2018-12-26 DIAGNOSIS — I5032 Chronic diastolic (congestive) heart failure: Secondary | ICD-10-CM | POA: Diagnosis not present

## 2018-12-26 DIAGNOSIS — I498 Other specified cardiac arrhythmias: Secondary | ICD-10-CM | POA: Diagnosis not present

## 2018-12-26 DIAGNOSIS — Z950 Presence of cardiac pacemaker: Secondary | ICD-10-CM | POA: Diagnosis not present

## 2018-12-26 DIAGNOSIS — I110 Hypertensive heart disease with heart failure: Secondary | ICD-10-CM | POA: Diagnosis not present

## 2018-12-26 DIAGNOSIS — Z6841 Body Mass Index (BMI) 40.0 and over, adult: Secondary | ICD-10-CM | POA: Diagnosis not present

## 2019-01-16 DIAGNOSIS — I10 Essential (primary) hypertension: Secondary | ICD-10-CM | POA: Diagnosis not present

## 2019-01-16 DIAGNOSIS — R0600 Dyspnea, unspecified: Secondary | ICD-10-CM | POA: Diagnosis not present

## 2019-01-16 DIAGNOSIS — I441 Atrioventricular block, second degree: Secondary | ICD-10-CM | POA: Diagnosis not present

## 2019-01-22 DIAGNOSIS — E7800 Pure hypercholesterolemia, unspecified: Secondary | ICD-10-CM | POA: Diagnosis not present

## 2019-01-22 DIAGNOSIS — Z23 Encounter for immunization: Secondary | ICD-10-CM | POA: Diagnosis not present

## 2019-01-22 DIAGNOSIS — Z950 Presence of cardiac pacemaker: Secondary | ICD-10-CM | POA: Diagnosis not present

## 2019-01-22 DIAGNOSIS — G500 Trigeminal neuralgia: Secondary | ICD-10-CM | POA: Diagnosis not present

## 2019-01-22 DIAGNOSIS — I10 Essential (primary) hypertension: Secondary | ICD-10-CM | POA: Diagnosis not present

## 2019-01-22 DIAGNOSIS — I77810 Thoracic aortic ectasia: Secondary | ICD-10-CM | POA: Diagnosis not present

## 2019-01-23 DIAGNOSIS — I441 Atrioventricular block, second degree: Secondary | ICD-10-CM | POA: Diagnosis not present

## 2019-01-23 DIAGNOSIS — I10 Essential (primary) hypertension: Secondary | ICD-10-CM | POA: Diagnosis not present

## 2019-01-23 DIAGNOSIS — R0600 Dyspnea, unspecified: Secondary | ICD-10-CM | POA: Diagnosis not present

## 2019-01-23 DIAGNOSIS — Z950 Presence of cardiac pacemaker: Secondary | ICD-10-CM | POA: Diagnosis not present

## 2019-01-23 DIAGNOSIS — E7800 Pure hypercholesterolemia, unspecified: Secondary | ICD-10-CM | POA: Diagnosis not present

## 2019-03-04 DIAGNOSIS — I10 Essential (primary) hypertension: Secondary | ICD-10-CM | POA: Diagnosis not present

## 2019-03-08 DIAGNOSIS — G500 Trigeminal neuralgia: Secondary | ICD-10-CM | POA: Diagnosis not present

## 2019-03-18 DIAGNOSIS — E7800 Pure hypercholesterolemia, unspecified: Secondary | ICD-10-CM | POA: Diagnosis not present

## 2019-03-18 DIAGNOSIS — I77810 Thoracic aortic ectasia: Secondary | ICD-10-CM | POA: Diagnosis not present

## 2019-03-18 DIAGNOSIS — I159 Secondary hypertension, unspecified: Secondary | ICD-10-CM | POA: Diagnosis not present

## 2019-03-18 DIAGNOSIS — I10 Essential (primary) hypertension: Secondary | ICD-10-CM | POA: Diagnosis not present

## 2019-03-18 DIAGNOSIS — I441 Atrioventricular block, second degree: Secondary | ICD-10-CM | POA: Diagnosis not present

## 2019-03-26 DIAGNOSIS — J309 Allergic rhinitis, unspecified: Secondary | ICD-10-CM | POA: Diagnosis not present

## 2019-03-26 DIAGNOSIS — R7989 Other specified abnormal findings of blood chemistry: Secondary | ICD-10-CM | POA: Diagnosis not present

## 2019-03-26 DIAGNOSIS — Z950 Presence of cardiac pacemaker: Secondary | ICD-10-CM | POA: Diagnosis not present

## 2019-03-26 DIAGNOSIS — E039 Hypothyroidism, unspecified: Secondary | ICD-10-CM | POA: Diagnosis not present

## 2019-03-26 DIAGNOSIS — E7800 Pure hypercholesterolemia, unspecified: Secondary | ICD-10-CM | POA: Diagnosis not present

## 2019-03-26 DIAGNOSIS — I77810 Thoracic aortic ectasia: Secondary | ICD-10-CM | POA: Diagnosis not present

## 2019-03-26 DIAGNOSIS — G500 Trigeminal neuralgia: Secondary | ICD-10-CM | POA: Diagnosis not present

## 2019-03-26 DIAGNOSIS — I10 Essential (primary) hypertension: Secondary | ICD-10-CM | POA: Diagnosis not present

## 2019-03-28 DIAGNOSIS — R7989 Other specified abnormal findings of blood chemistry: Secondary | ICD-10-CM | POA: Diagnosis not present

## 2019-04-03 DIAGNOSIS — Z45018 Encounter for adjustment and management of other part of cardiac pacemaker: Secondary | ICD-10-CM | POA: Diagnosis not present

## 2019-04-03 DIAGNOSIS — I441 Atrioventricular block, second degree: Secondary | ICD-10-CM | POA: Diagnosis not present

## 2019-04-03 DIAGNOSIS — Z950 Presence of cardiac pacemaker: Secondary | ICD-10-CM | POA: Diagnosis not present

## 2019-04-11 DIAGNOSIS — E7800 Pure hypercholesterolemia, unspecified: Secondary | ICD-10-CM | POA: Diagnosis not present

## 2019-04-11 DIAGNOSIS — R7989 Other specified abnormal findings of blood chemistry: Secondary | ICD-10-CM | POA: Diagnosis not present

## 2019-04-11 DIAGNOSIS — Z125 Encounter for screening for malignant neoplasm of prostate: Secondary | ICD-10-CM | POA: Diagnosis not present

## 2019-04-11 DIAGNOSIS — I10 Essential (primary) hypertension: Secondary | ICD-10-CM | POA: Diagnosis not present

## 2019-04-11 DIAGNOSIS — I77810 Thoracic aortic ectasia: Secondary | ICD-10-CM | POA: Diagnosis not present

## 2019-04-11 DIAGNOSIS — I441 Atrioventricular block, second degree: Secondary | ICD-10-CM | POA: Diagnosis not present

## 2019-04-11 DIAGNOSIS — Z950 Presence of cardiac pacemaker: Secondary | ICD-10-CM | POA: Diagnosis not present

## 2019-04-11 DIAGNOSIS — E790 Hyperuricemia without signs of inflammatory arthritis and tophaceous disease: Secondary | ICD-10-CM | POA: Diagnosis not present

## 2019-04-11 DIAGNOSIS — G500 Trigeminal neuralgia: Secondary | ICD-10-CM | POA: Diagnosis not present

## 2019-04-11 DIAGNOSIS — E039 Hypothyroidism, unspecified: Secondary | ICD-10-CM | POA: Diagnosis not present

## 2019-04-30 DIAGNOSIS — I77810 Thoracic aortic ectasia: Secondary | ICD-10-CM | POA: Diagnosis not present

## 2019-05-14 DIAGNOSIS — D239 Other benign neoplasm of skin, unspecified: Secondary | ICD-10-CM | POA: Diagnosis not present

## 2019-05-14 DIAGNOSIS — Z85828 Personal history of other malignant neoplasm of skin: Secondary | ICD-10-CM | POA: Diagnosis not present

## 2019-05-14 DIAGNOSIS — L821 Other seborrheic keratosis: Secondary | ICD-10-CM | POA: Diagnosis not present

## 2019-05-17 DIAGNOSIS — K760 Fatty (change of) liver, not elsewhere classified: Secondary | ICD-10-CM | POA: Diagnosis not present

## 2019-05-17 DIAGNOSIS — R7989 Other specified abnormal findings of blood chemistry: Secondary | ICD-10-CM | POA: Diagnosis not present

## 2019-05-23 DIAGNOSIS — E039 Hypothyroidism, unspecified: Secondary | ICD-10-CM | POA: Diagnosis not present

## 2019-06-10 DIAGNOSIS — G40109 Localization-related (focal) (partial) symptomatic epilepsy and epileptic syndromes with simple partial seizures, not intractable, without status epilepticus: Secondary | ICD-10-CM | POA: Diagnosis not present

## 2019-06-19 DIAGNOSIS — E669 Obesity, unspecified: Secondary | ICD-10-CM | POA: Diagnosis not present

## 2019-06-19 DIAGNOSIS — E785 Hyperlipidemia, unspecified: Secondary | ICD-10-CM | POA: Diagnosis not present

## 2019-06-19 DIAGNOSIS — K760 Fatty (change of) liver, not elsewhere classified: Secondary | ICD-10-CM | POA: Diagnosis not present

## 2019-06-19 DIAGNOSIS — R7989 Other specified abnormal findings of blood chemistry: Secondary | ICD-10-CM | POA: Diagnosis not present

## 2019-06-19 DIAGNOSIS — D696 Thrombocytopenia, unspecified: Secondary | ICD-10-CM | POA: Diagnosis not present

## 2019-07-02 DIAGNOSIS — Z45018 Encounter for adjustment and management of other part of cardiac pacemaker: Secondary | ICD-10-CM | POA: Diagnosis not present

## 2019-07-02 DIAGNOSIS — R000 Tachycardia, unspecified: Secondary | ICD-10-CM | POA: Diagnosis not present

## 2019-07-02 DIAGNOSIS — Z950 Presence of cardiac pacemaker: Secondary | ICD-10-CM | POA: Diagnosis not present

## 2019-07-09 DIAGNOSIS — E669 Obesity, unspecified: Secondary | ICD-10-CM | POA: Diagnosis not present

## 2019-07-09 DIAGNOSIS — K760 Fatty (change of) liver, not elsewhere classified: Secondary | ICD-10-CM | POA: Diagnosis not present

## 2019-07-09 DIAGNOSIS — D696 Thrombocytopenia, unspecified: Secondary | ICD-10-CM | POA: Diagnosis not present

## 2019-07-09 DIAGNOSIS — R7989 Other specified abnormal findings of blood chemistry: Secondary | ICD-10-CM | POA: Diagnosis not present

## 2019-07-09 DIAGNOSIS — E785 Hyperlipidemia, unspecified: Secondary | ICD-10-CM | POA: Diagnosis not present

## 2019-07-23 DIAGNOSIS — E039 Hypothyroidism, unspecified: Secondary | ICD-10-CM | POA: Diagnosis not present

## 2019-07-23 DIAGNOSIS — I441 Atrioventricular block, second degree: Secondary | ICD-10-CM | POA: Diagnosis not present

## 2019-07-23 DIAGNOSIS — Z950 Presence of cardiac pacemaker: Secondary | ICD-10-CM | POA: Diagnosis not present

## 2019-07-23 DIAGNOSIS — R0600 Dyspnea, unspecified: Secondary | ICD-10-CM | POA: Diagnosis not present

## 2019-07-23 DIAGNOSIS — I10 Essential (primary) hypertension: Secondary | ICD-10-CM | POA: Diagnosis not present

## 2019-07-23 DIAGNOSIS — K7581 Nonalcoholic steatohepatitis (NASH): Secondary | ICD-10-CM | POA: Diagnosis not present

## 2019-07-23 DIAGNOSIS — R739 Hyperglycemia, unspecified: Secondary | ICD-10-CM | POA: Diagnosis not present

## 2019-07-23 DIAGNOSIS — I77810 Thoracic aortic ectasia: Secondary | ICD-10-CM | POA: Diagnosis not present

## 2019-07-23 DIAGNOSIS — G500 Trigeminal neuralgia: Secondary | ICD-10-CM | POA: Diagnosis not present

## 2019-07-23 DIAGNOSIS — E7800 Pure hypercholesterolemia, unspecified: Secondary | ICD-10-CM | POA: Diagnosis not present

## 2019-08-30 DIAGNOSIS — I77810 Thoracic aortic ectasia: Secondary | ICD-10-CM | POA: Diagnosis not present

## 2019-08-30 DIAGNOSIS — I10 Essential (primary) hypertension: Secondary | ICD-10-CM | POA: Diagnosis not present

## 2019-08-30 DIAGNOSIS — E7800 Pure hypercholesterolemia, unspecified: Secondary | ICD-10-CM | POA: Diagnosis not present

## 2019-10-01 DIAGNOSIS — K760 Fatty (change of) liver, not elsewhere classified: Secondary | ICD-10-CM | POA: Diagnosis not present

## 2019-10-01 DIAGNOSIS — R748 Abnormal levels of other serum enzymes: Secondary | ICD-10-CM | POA: Diagnosis not present

## 2019-10-01 DIAGNOSIS — K7400 Hepatic fibrosis, unspecified: Secondary | ICD-10-CM | POA: Diagnosis not present

## 2019-10-09 DIAGNOSIS — I441 Atrioventricular block, second degree: Secondary | ICD-10-CM | POA: Diagnosis not present

## 2019-10-09 DIAGNOSIS — Z950 Presence of cardiac pacemaker: Secondary | ICD-10-CM | POA: Diagnosis not present

## 2019-10-09 DIAGNOSIS — G500 Trigeminal neuralgia: Secondary | ICD-10-CM | POA: Diagnosis not present

## 2019-10-22 DIAGNOSIS — G500 Trigeminal neuralgia: Secondary | ICD-10-CM | POA: Diagnosis not present

## 2019-10-22 DIAGNOSIS — I77810 Thoracic aortic ectasia: Secondary | ICD-10-CM | POA: Diagnosis not present

## 2019-10-22 DIAGNOSIS — E7800 Pure hypercholesterolemia, unspecified: Secondary | ICD-10-CM | POA: Diagnosis not present

## 2019-10-22 DIAGNOSIS — Z0000 Encounter for general adult medical examination without abnormal findings: Secondary | ICD-10-CM | POA: Diagnosis not present

## 2019-10-22 DIAGNOSIS — Z950 Presence of cardiac pacemaker: Secondary | ICD-10-CM | POA: Diagnosis not present

## 2019-10-22 DIAGNOSIS — I10 Essential (primary) hypertension: Secondary | ICD-10-CM | POA: Diagnosis not present

## 2019-10-22 DIAGNOSIS — E039 Hypothyroidism, unspecified: Secondary | ICD-10-CM | POA: Diagnosis not present

## 2019-10-22 DIAGNOSIS — I441 Atrioventricular block, second degree: Secondary | ICD-10-CM | POA: Diagnosis not present

## 2019-10-22 DIAGNOSIS — Z6841 Body Mass Index (BMI) 40.0 and over, adult: Secondary | ICD-10-CM | POA: Diagnosis not present

## 2019-10-22 DIAGNOSIS — R739 Hyperglycemia, unspecified: Secondary | ICD-10-CM | POA: Diagnosis not present

## 2019-10-22 DIAGNOSIS — K7581 Nonalcoholic steatohepatitis (NASH): Secondary | ICD-10-CM | POA: Diagnosis not present

## 2019-11-03 IMAGING — CR DG CHEST 2V
1 series · 2 of 2 positions shown · non-contrast
Comparison: 04/21/2017

CLINICAL DATA: Patient reports productive cough with white mucous
and SOB x 2 weeks. Patient also complaining of mid chest pain only
when coughing. Denies known fever. Reports he had a vomiting episode
this morning due to coughing. Hx of HTN. Non-smoker.

EXAM:
CHEST - 2 VIEW

[Series 1: dg chest 2 view · 0.14mm/px · 2 of 2 slices shown]
[im 1/2]
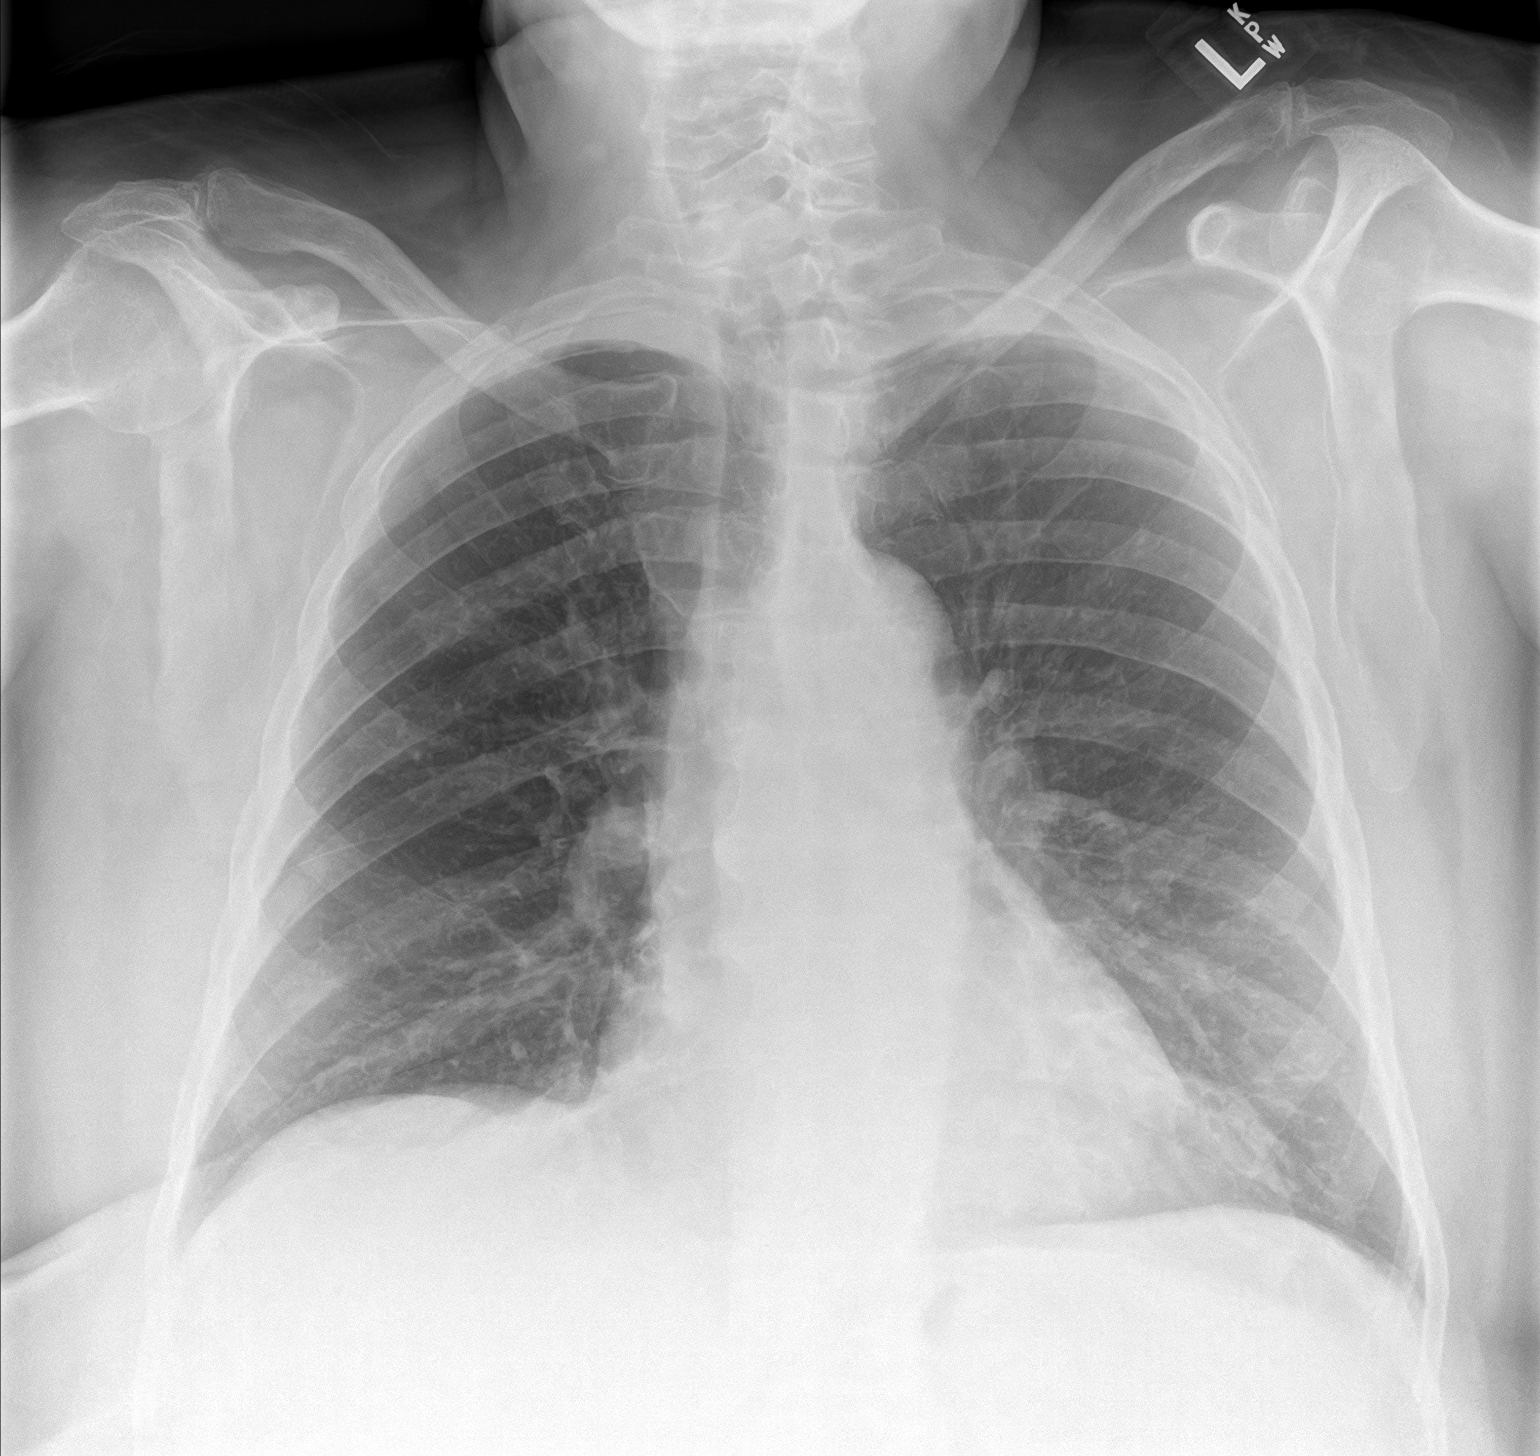
[im 2/2]
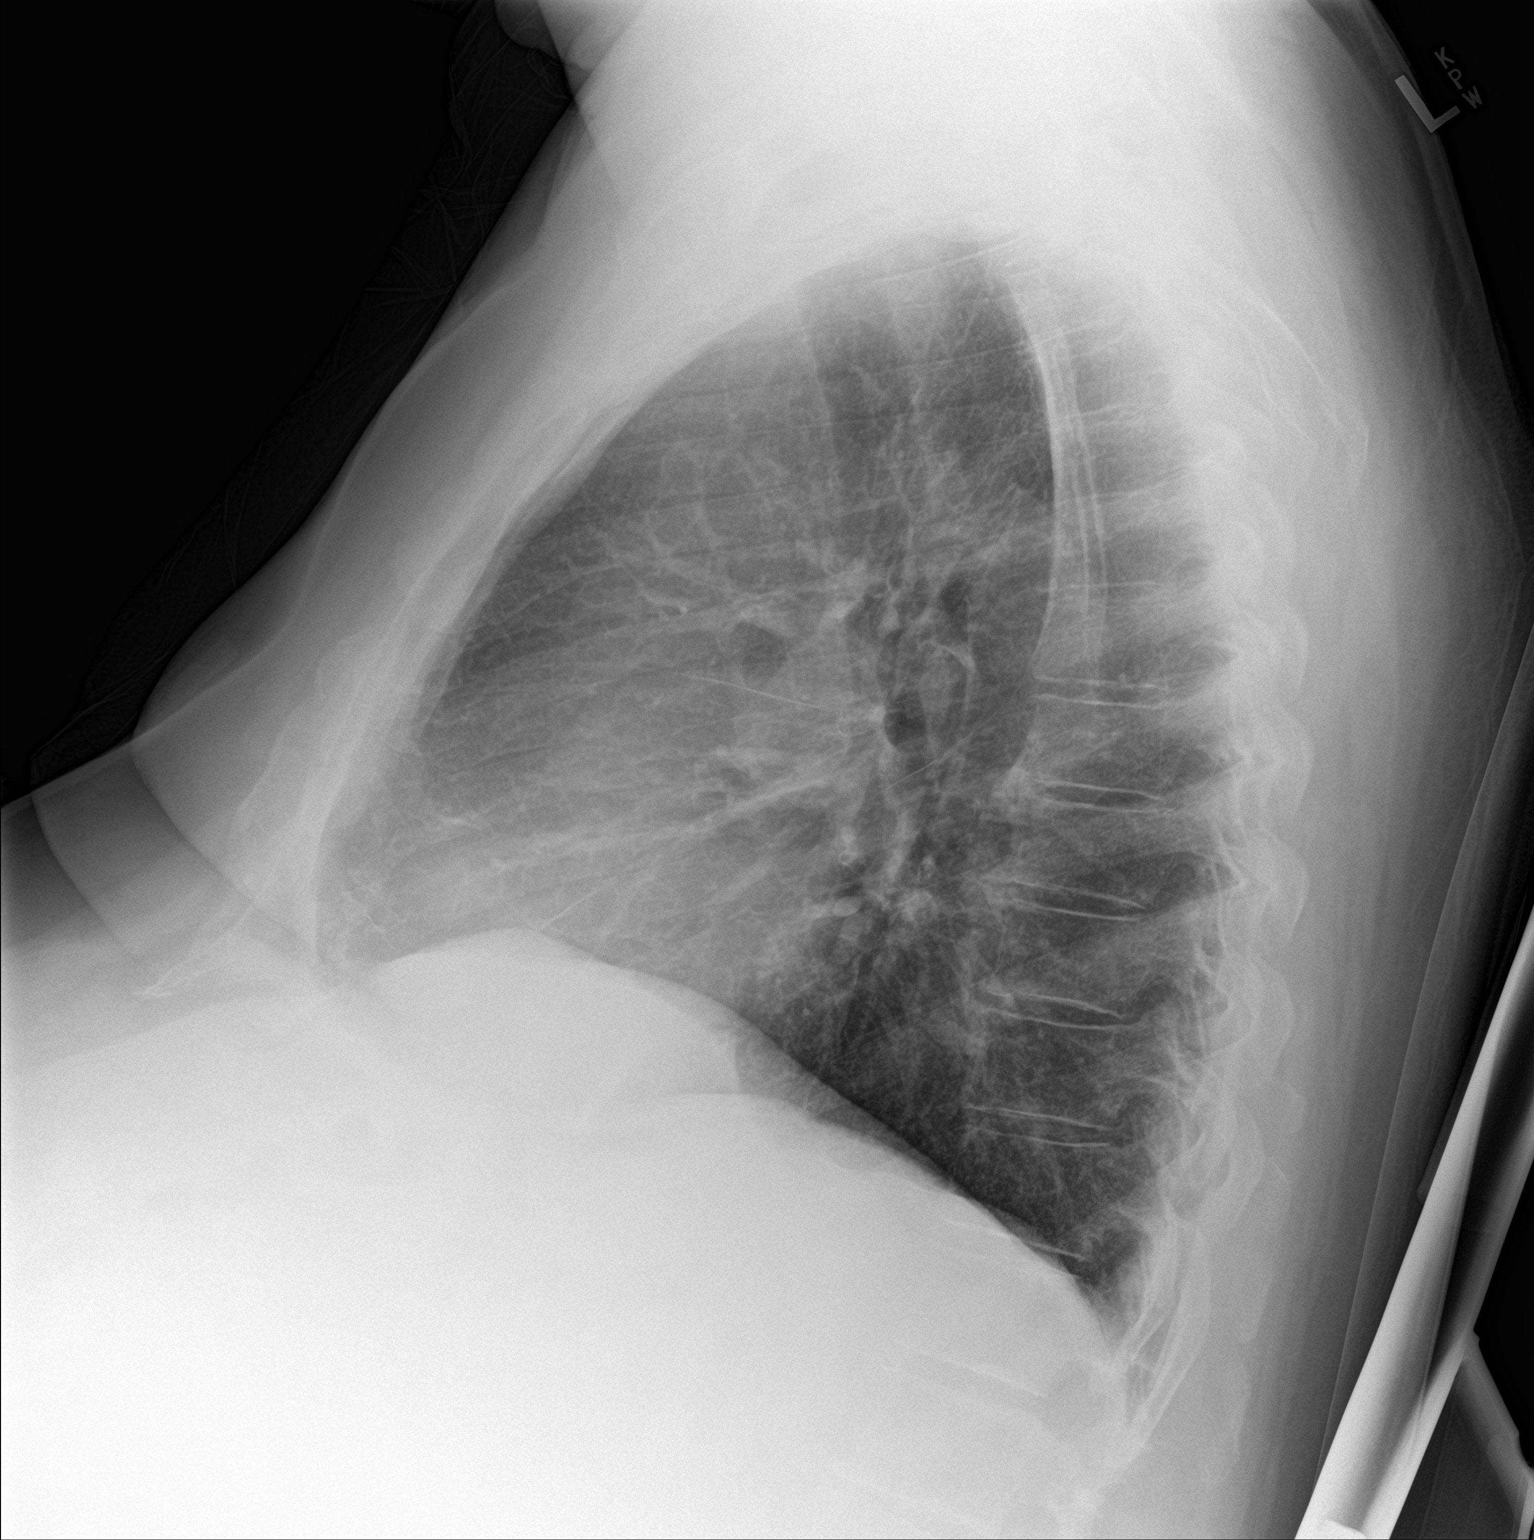

[2 of 2 positions shown; findings below may reference images not displayed]

FINDINGS: Cardiac silhouette is normal in size and configuration. No
mediastinal or hilar masses. No evidence of adenopathy.

Prominent bronchovascular markings in the lung bases, similar to the
prior exam. Lungs otherwise clear. No pleural effusion or
pneumothorax.

Skeletal structures are intact.
IMPRESSION: No active cardiopulmonary disease.

## 2019-11-04 DIAGNOSIS — R748 Abnormal levels of other serum enzymes: Secondary | ICD-10-CM | POA: Diagnosis not present

## 2019-11-04 DIAGNOSIS — K760 Fatty (change of) liver, not elsewhere classified: Secondary | ICD-10-CM | POA: Diagnosis not present

## 2019-11-04 DIAGNOSIS — K7400 Hepatic fibrosis, unspecified: Secondary | ICD-10-CM | POA: Diagnosis not present

## 2019-12-25 DIAGNOSIS — D485 Neoplasm of uncertain behavior of skin: Secondary | ICD-10-CM | POA: Diagnosis not present

## 2019-12-25 DIAGNOSIS — L905 Scar conditions and fibrosis of skin: Secondary | ICD-10-CM | POA: Diagnosis not present

## 2020-01-09 DIAGNOSIS — I441 Atrioventricular block, second degree: Secondary | ICD-10-CM | POA: Diagnosis not present

## 2020-01-09 DIAGNOSIS — Z45018 Encounter for adjustment and management of other part of cardiac pacemaker: Secondary | ICD-10-CM | POA: Diagnosis not present

## 2020-01-09 DIAGNOSIS — Z950 Presence of cardiac pacemaker: Secondary | ICD-10-CM | POA: Diagnosis not present

## 2020-01-28 DIAGNOSIS — E039 Hypothyroidism, unspecified: Secondary | ICD-10-CM | POA: Diagnosis not present

## 2020-01-28 DIAGNOSIS — I77810 Thoracic aortic ectasia: Secondary | ICD-10-CM | POA: Diagnosis not present

## 2020-01-28 DIAGNOSIS — K7581 Nonalcoholic steatohepatitis (NASH): Secondary | ICD-10-CM | POA: Diagnosis not present

## 2020-01-28 DIAGNOSIS — I10 Essential (primary) hypertension: Secondary | ICD-10-CM | POA: Diagnosis not present

## 2020-01-28 DIAGNOSIS — G500 Trigeminal neuralgia: Secondary | ICD-10-CM | POA: Diagnosis not present

## 2020-01-28 DIAGNOSIS — E7800 Pure hypercholesterolemia, unspecified: Secondary | ICD-10-CM | POA: Diagnosis not present

## 2020-01-28 DIAGNOSIS — Z6841 Body Mass Index (BMI) 40.0 and over, adult: Secondary | ICD-10-CM | POA: Diagnosis not present

## 2020-01-28 DIAGNOSIS — R739 Hyperglycemia, unspecified: Secondary | ICD-10-CM | POA: Diagnosis not present

## 2020-02-26 DIAGNOSIS — E039 Hypothyroidism, unspecified: Secondary | ICD-10-CM | POA: Diagnosis not present

## 2020-04-01 DIAGNOSIS — R748 Abnormal levels of other serum enzymes: Secondary | ICD-10-CM | POA: Diagnosis not present

## 2020-04-01 DIAGNOSIS — K7400 Hepatic fibrosis, unspecified: Secondary | ICD-10-CM | POA: Diagnosis not present

## 2020-04-02 DIAGNOSIS — H25013 Cortical age-related cataract, bilateral: Secondary | ICD-10-CM | POA: Diagnosis not present

## 2020-04-09 DIAGNOSIS — G500 Trigeminal neuralgia: Secondary | ICD-10-CM | POA: Diagnosis not present

## 2020-04-09 DIAGNOSIS — G40109 Localization-related (focal) (partial) symptomatic epilepsy and epileptic syndromes with simple partial seizures, not intractable, without status epilepticus: Secondary | ICD-10-CM | POA: Diagnosis not present

## 2020-04-14 DIAGNOSIS — Z45018 Encounter for adjustment and management of other part of cardiac pacemaker: Secondary | ICD-10-CM | POA: Diagnosis not present

## 2020-04-14 DIAGNOSIS — Z950 Presence of cardiac pacemaker: Secondary | ICD-10-CM | POA: Diagnosis not present

## 2020-04-14 DIAGNOSIS — I441 Atrioventricular block, second degree: Secondary | ICD-10-CM | POA: Diagnosis not present

## 2021-04-25 ENCOUNTER — Encounter: Payer: Self-pay | Admitting: Gastroenterology

## 2021-04-25 NOTE — H&P (Signed)
Pre-Procedure H&P   Patient ID: Brian Huffman. is a 76 y.o. male.  Gastroenterology Provider: Annamaria Helling, DO  PCP: Elaina Pattee, MD  Date: 04/26/2021  HPI Brian Huffman. is a 76 y.o. male who presents today for Colonoscopy for Surveillance- personal history colon polyps; fhx CRC- father & sister (in 43s).  Evaluated by Dr. Laurance Flatten to be intermediate risk for procedures. 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 Known ascending aortic aneurysm  Has regular BM w/o blood/melena. No diarrhea/constipation or abdominal pain.  Hgb 14.5 mcv 43, cr 0.9.  Strong fhx crc- father and sister in 40s  No other acute gi complaints.  Colonoscopy 12/2017- 1 tubular adenoma in asc colon, IH 2015 multiple adenomas  Past Medical History:  Diagnosis Date   Allergic genetic state    Family hx of colon cancer    Gout    HLD (hyperlipidemia)    Hypertension    Hypothyroidism    Obesity    Trigeminal neuralgia    Trigeminal neuralgia    Vertigo     Past Surgical History:  Procedure Laterality Date   COLONOSCOPY W/ POLYPECTOMY  2002, 2003, 2007, 2010, 2015.   adenomatous polyps   COLONOSCOPY WITH PROPOFOL N/A 01/10/2018   Procedure: COLONOSCOPY WITH PROPOFOL;  Surgeon: Manya Silvas, MD;  Location: [facility] ENDOSCOPY;  Service: Endoscopy;  Laterality: N/A;   INSERT / REPLACE / REMOVE PACEMAKER      Family History Father and sister with crc in their 59s No other h/o GI disease or malignancy  Review of Systems  Constitutional:  Negative for activity change, appetite change, chills, diaphoresis, fatigue, fever and unexpected weight change.  HENT:  Negative for trouble swallowing and voice change.   Respiratory:  Negative for shortness of breath and wheezing.   Cardiovascular:  Negative for chest pain, palpitations and leg swelling.  Gastrointestinal:  Negative for abdominal distention, abdominal pain, anal  bleeding, blood in stool, constipation, diarrhea, nausea and vomiting.  Musculoskeletal:  Negative for arthralgias and myalgias.  Skin:  Negative for color change and pallor.  Neurological:  Positive for dizziness and light-headedness. Negative for syncope and weakness.  Psychiatric/Behavioral:  Negative for confusion. The patient is not nervous/anxious.   All other systems reviewed and are negative.   Medications No current facility-administered medications on file prior to encounter.   Current Outpatient Medications on File Prior to Encounter  Medication Sig Dispense Refill   albuterol (PROVENTIL HFA;VENTOLIN HFA) 108 (90 Base) MCG/ACT inhaler Inhale 2 puffs into the lungs every 6 (six) hours as needed for wheezing or shortness of breath. 1 Inhaler 2   allopurinol (ZYLOPRIM) 100 MG tablet Take 1 tablet by mouth daily.     amLODipine (NORVASC) 5 MG tablet Take 5 mg by mouth daily.     atorvastatin (LIPITOR) 10 MG tablet Take 10 mg by mouth daily.     B Complex Vitamins (VITAMIN B COMPLEX PO) Take by mouth daily.     candesartan (ATACAND) 8 MG tablet Take 1 tablet by mouth 2 (two) times daily.     carbamazepine (TEGRETOL XR) 100 MG 12 hr tablet Take 100 mg by mouth 4 (four) times daily.     Cholecalciferol (VITAMIN D3) 250 MCG (10000 UT) TABS Take 1 tablet by mouth daily.     Coenzyme Q10 100 MG TABS Take 1 tablet by mouth daily.     dimenhyDRINATE (DRAMAMINE PO) Take by mouth.  For itchiness or help with sleep     ezetimibe (ZETIA) 10 MG tablet Take 5 mg by mouth daily.     furosemide (LASIX) 20 MG tablet Take 40 mg by mouth daily.     gabapentin (NEURONTIN) 100 MG capsule Take 100 mg by mouth 3 (three) times daily.     levothyroxine (SYNTHROID, LEVOTHROID) 175 MCG tablet Take 175 mcg by mouth daily before breakfast.     meclizine (ANTIVERT) 25 MG tablet Take 25 mg by mouth 3 (three) times daily as needed for dizziness.     milk thistle 175 MG tablet Take 175 mg by mouth daily.      Multiple Vitamin (MULTIVITAMIN) tablet Take 1 tablet by mouth daily.     omeprazole (PRILOSEC) 20 MG capsule Take 20 mg by mouth 2 (two) times daily before a meal.     Potassium 99 MG TABS Take by mouth daily.     pravastatin (PRAVACHOL) 10 MG tablet Take 10 mg by mouth daily.     promethazine (PHENERGAN) 25 MG tablet Take 25 mg by mouth every 6 (six) hours as needed for nausea or vomiting.     rosuvastatin (CRESTOR) 10 MG tablet Take 10 mg by mouth at bedtime.     tamsulosin (FLOMAX) 0.4 MG CAPS capsule Take 0.4 mg by mouth daily.     tiotropium (SPIRIVA) 18 MCG inhalation capsule Place 1 capsule (18 mcg total) into inhaler and inhale daily. 30 capsule 1   traZODone (DESYREL) 50 MG tablet Take 50 mg by mouth at bedtime.     valsartan (DIOVAN) 320 MG tablet Take 320 mg by mouth daily.     azelastine (ASTELIN) 0.1 % nasal spray Place 2 sprays into both nostrils 2 (two) times daily. Use in each nostril as directed     benzonatate (TESSALON) 100 MG capsule Take 100 mg by mouth 3 (three) times daily as needed for cough.     cetirizine (ZYRTEC) 10 MG tablet Take 10 mg by mouth daily.     chlorpheniramine-HYDROcodone (TUSSIONEX PENNKINETIC ER) 10-8 MG/5ML SUER Take 5 mLs by mouth at bedtime as needed for cough. 50 mL 0   fluticasone (FLONASE) 50 MCG/ACT nasal spray Place 2 sprays into both nostrils daily.     guaiFENesin-codeine 100-10 MG/5ML syrup Take 10 mLs by mouth 3 (three) times daily as needed for cough. 120 mL 0   memantine (NAMENDA) 5 MG tablet Take 5 mg by mouth 2 (two) times daily. (Patient not taking: Reported on 04/26/2021)     ondansetron (ZOFRAN-ODT) 4 MG disintegrating tablet Take 4 mg by mouth every 8 (eight) hours as needed for nausea or vomiting.     predniSONE (DELTASONE) 20 MG tablet 3 tabs po once day 1, then 2 tabs po qd x 2 days, then 1 tab po qd x 2 days, then half a tab po qd x 2 days (Patient not taking: Reported on 01/10/2018) 10 tablet 0   ranitidine (ZANTAC) 300 MG tablet Take  300 mg by mouth 3 times/day as needed-between meals & bedtime.  (Patient not taking: Reported on 04/26/2021)     Spacer/Aero-Holding Chambers (AEROCHAMBER MV) inhaler Use as instructed 1 each 0   topiramate (TOPAMAX) 25 MG tablet Take 25 mg by mouth 2 (two) times daily.      Pertinent medications related to GI and procedure were reviewed by me with the patient prior to the procedure   Current Facility-Administered Medications:    0.9 %  sodium chloride infusion, ,  Intravenous, Continuous, Annamaria Helling, DO, Last Rate: 20 mL/hr at 04/26/21 1046, New Bag at 04/26/21 1046      Allergies  Allergen Reactions   [place] Grass Extract Other (See Comments)    Difficulty breathing   Other Other (See Comments)    DUST, sneezing, watery eyes   Statins Other (See Comments)    Atorvastatin, simvastatin and pravastatin     Penicillins Rash   Shellfish-Derived Products Rash   Allergies were reviewed by me prior to the procedure  Objective    Vitals:   04/26/21 1015  BP: (!) 162/80  Pulse: 68  Resp: 16  Temp: (!) 97.5 F (36.4 C)  TempSrc: Temporal  SpO2: 98%  Weight: 108.9 kg  Height: 5\' 5"  (1.651 m)     Physical Exam Vitals and nursing note reviewed.  Constitutional:      General: He is not in acute distress.    Appearance: Normal appearance. He is not ill-appearing, toxic-appearing or diaphoretic.  HENT:     Head: Normocephalic and atraumatic.     Nose: Nose normal.     Mouth/Throat:     Mouth: Mucous membranes are moist.     Pharynx: Oropharynx is clear.  Eyes:     General: No scleral icterus.    Extraocular Movements: Extraocular movements intact.  Cardiovascular:     Rate and Rhythm: Normal rate and regular rhythm.     Heart sounds: Normal heart sounds. No murmur heard.   No friction rub. No gallop.  Pulmonary:     Effort: Pulmonary effort is normal. No respiratory distress.     Breath sounds: Normal breath sounds. No wheezing, rhonchi or rales.  Abdominal:      General: Bowel sounds are normal. There is no distension.     Palpations: Abdomen is soft.     Tenderness: There is no abdominal tenderness. There is no guarding or rebound.  Musculoskeletal:     Cervical back: Neck supple.     Right lower leg: No edema.     Left lower leg: No edema.  Skin:    General: Skin is warm and dry.     Coloration: Skin is not jaundiced or pale.  Neurological:     General: No focal deficit present.     Mental Status: He is alert and oriented to person, place, and time. Mental status is at baseline.  Psychiatric:        Mood and Affect: Mood normal.        Behavior: Behavior normal.        Thought Content: Thought content normal.        Judgment: Judgment normal.     Assessment:  Mr. Brian Huffman. is a 76 y.o. male  who presents today for Colonoscopy for Surveillance- personal history colon polyps; fhx CRC- father & sister (in 93s).  Plan:  Colonoscopy with possible intervention today  Colonoscopy with possible biopsy, control of bleeding, polypectomy, and interventions as necessary has been discussed with the patient/patient representative. Informed consent was obtained from the patient/patient representative after explaining the indication, nature, and risks of the procedure including but not limited to death, bleeding, perforation, missed neoplasm/lesions, cardiorespiratory compromise, and reaction to medications. Opportunity for questions was given and appropriate answers were provided. Patient/patient representative has verbalized understanding is amenable to undergoing the procedure.   Annamaria Helling, DO  [facility]  Portions of the record may have been created with voice recognition software. Occasional wrong-word or 'sound-a-like'  substitutions may have occurred due to the inherent limitations of voice recognition software.  Read the chart carefully and recognize, using context, where substitutions may have  occurred.

## 2021-04-26 ENCOUNTER — Ambulatory Visit: Payer: Medicare Other | Admitting: Anesthesiology

## 2021-04-26 ENCOUNTER — Other Ambulatory Visit: Payer: Self-pay

## 2021-04-26 ENCOUNTER — Encounter: Payer: Self-pay | Admitting: Gastroenterology

## 2021-04-26 ENCOUNTER — Ambulatory Visit
Admission: RE | Admit: 2021-04-26 | Discharge: 2021-04-26 | Disposition: A | Discharge status: Home / Self Care | Payer: Medicare Other | Source: Ambulatory Visit | Attending: Gastroenterology | Admitting: Gastroenterology

## 2021-04-26 ENCOUNTER — Encounter
Admission: RE | Disposition: A | Discharge status: Home / Self Care | Payer: Self-pay | Source: Ambulatory Visit | Attending: Gastroenterology

## 2021-04-26 DIAGNOSIS — D124 Benign neoplasm of descending colon: Secondary | ICD-10-CM | POA: Diagnosis not present

## 2021-04-26 DIAGNOSIS — K621 Rectal polyp: Secondary | ICD-10-CM | POA: Insufficient documentation

## 2021-04-26 DIAGNOSIS — K640 First degree hemorrhoids: Secondary | ICD-10-CM | POA: Diagnosis not present

## 2021-04-26 DIAGNOSIS — E039 Hypothyroidism, unspecified: Secondary | ICD-10-CM | POA: Diagnosis not present

## 2021-04-26 DIAGNOSIS — Z800 Family history of malignant neoplasm of digestive organs: Secondary | ICD-10-CM | POA: Insufficient documentation

## 2021-04-26 DIAGNOSIS — E119 Type 2 diabetes mellitus without complications: Secondary | ICD-10-CM | POA: Diagnosis not present

## 2021-04-26 DIAGNOSIS — Z950 Presence of cardiac pacemaker: Secondary | ICD-10-CM | POA: Diagnosis not present

## 2021-04-26 DIAGNOSIS — I10 Essential (primary) hypertension: Secondary | ICD-10-CM | POA: Insufficient documentation

## 2021-04-26 DIAGNOSIS — K219 Gastro-esophageal reflux disease without esophagitis: Secondary | ICD-10-CM | POA: Diagnosis not present

## 2021-04-26 DIAGNOSIS — K5730 Diverticulosis of large intestine without perforation or abscess without bleeding: Secondary | ICD-10-CM | POA: Diagnosis not present

## 2021-04-26 DIAGNOSIS — D125 Benign neoplasm of sigmoid colon: Secondary | ICD-10-CM | POA: Insufficient documentation

## 2021-04-26 DIAGNOSIS — I7121 Aneurysm of the ascending aorta, without rupture: Secondary | ICD-10-CM | POA: Insufficient documentation

## 2021-04-26 DIAGNOSIS — I209 Angina pectoris, unspecified: Secondary | ICD-10-CM | POA: Diagnosis not present

## 2021-04-26 DIAGNOSIS — Z09 Encounter for follow-up examination after completed treatment for conditions other than malignant neoplasm: Secondary | ICD-10-CM | POA: Diagnosis present

## 2021-04-26 HISTORY — PX: COLONOSCOPY: SHX5424

## 2021-04-26 LAB — GLUCOSE, CAPILLARY: Glucose-Capillary: 111 mg/dL — ABNORMAL HIGH (ref 70–99)

## 2021-04-26 SURGERY — COLONOSCOPY
Anesthesia: General

## 2021-04-26 MED ORDER — PROPOFOL 10 MG/ML IV BOLUS
INTRAVENOUS | Status: DC | PRN
  Administered 2021-04-26: 60 mg via INTRAVENOUS

## 2021-04-26 MED ORDER — EPHEDRINE SULFATE 50 MG/ML IJ SOLN
INTRAMUSCULAR | Status: DC | PRN
  Administered 2021-04-26 (×3): 5 mg via INTRAVENOUS

## 2021-04-26 MED ORDER — PROPOFOL 500 MG/50ML IV EMUL
INTRAVENOUS | Status: DC | PRN
  Administered 2021-04-26: 140 ug/kg/min via INTRAVENOUS

## 2021-04-26 MED ORDER — PHENYLEPHRINE HCL (PRESSORS) 10 MG/ML IV SOLN
INTRAVENOUS | Status: DC | PRN
  Administered 2021-04-26 (×2): 80 ug via INTRAVENOUS

## 2021-04-26 MED ORDER — SODIUM CHLORIDE 0.9 % IV SOLN: INTRAVENOUS | Status: DC

## 2021-04-26 MED ORDER — ONDANSETRON HCL 4 MG/2ML IJ SOLN
INTRAMUSCULAR | Status: DC | PRN
  Administered 2021-04-26: 4 mg via INTRAVENOUS

## 2021-04-26 NOTE — Op Note (Signed)
[facility] Gastroenterology Patient Name: Brian Huffman Procedure Date: 04/26/2021 11:07 AM MRN: [id number] Account #: [id number] Date of Birth: 07-May-1945 Admit Type: Outpatient Age: 76 Room: [facility] ENDO ROOM 2 Gender: Male Note Status: Finalized Instrument Name: Jasper Riling [id number] Procedure:             Colonoscopy Indications:           High risk colon cancer surveillance: Personal history                         of colonic polyps, Family history of colon cancer in                         multiple first-degree relatives Providers:             Annamaria Helling DO, DO Medicines:             Monitored Anesthesia Care Complications:         No immediate complications. Estimated blood loss:                         Minimal. Procedure:             Pre-Anesthesia Assessment:                        - Prior to the procedure, a History and Physical was                         performed, and patient medications and allergies were                         reviewed. The patient is competent. The risks and                         benefits of the procedure and the sedation options and                         risks were discussed with the patient. All questions                         were answered and informed consent was obtained.                         Patient identification and proposed procedure were                         verified by the physician, the nurse, the anesthetist                         and the technician in the endoscopy suite. Mental                         Status Examination: alert and oriented. Airway                         Examination: normal oropharyngeal airway and neck                         mobility. Respiratory Examination: clear to  auscultation. CV Examination: RRR, no murmurs, no S3                         or S4. Prophylactic Antibiotics: The patient does not                         require prophylactic antibiotics.  Prior                         Anticoagulants: The patient has taken no previous                         anticoagulant or antiplatelet agents. ASA Grade                         Assessment: III - A patient with severe systemic                         disease. After reviewing the risks and benefits, the                         patient was deemed in satisfactory condition to                         undergo the procedure. The anesthesia plan was to use                         monitored anesthesia care (MAC). Immediately prior to                         administration of medications, the patient was                         re-assessed for adequacy to receive sedatives. The                         heart rate, respiratory rate, oxygen saturations,                         blood pressure, adequacy of pulmonary ventilation, and                         response to care were monitored throughout the                         procedure. The physical status of the patient was                         re-assessed after the procedure.                        After obtaining informed consent, the colonoscope was                         passed under direct vision. Throughout the procedure,                         the patient's blood pressure, pulse, and oxygen  saturations were monitored continuously. The                         Colonoscope was introduced through the anus and                         advanced to the the terminal ileum, with                         identification of the appendiceal orifice and IC                         valve. The colonoscopy was performed without                         difficulty. The patient tolerated the procedure well.                         The quality of the bowel preparation was evaluated                         using the BBPS [facility] Bowel Preparation Scale) with                         scores of: Right Colon = 2 (minor amount of residual                          staining, small fragments of stool and/or opaque                         liquid, but mucosa seen well), Transverse Colon = 3                         (entire mucosa seen well with no residual staining,                         small fragments of stool or opaque liquid) and Left                         Colon = 3 (entire mucosa seen well with no residual                         staining, small fragments of stool or opaque liquid).                         The total BBPS score equals 8. The quality of the                         bowel preparation was excellent. Findings:      The perianal and digital rectal examinations were normal. Pertinent       negatives include normal sphincter tone.      The terminal ileum appeared normal. Estimated blood loss: none.      Multiple small-mouthed diverticula were found in the left colon.       Estimated blood loss: none.      Non-bleeding internal hemorrhoids were found during retroflexion. The       hemorrhoids were  Grade I (internal hemorrhoids that do not prolapse).       Estimated blood loss: none.      A 1 to 2 mm polyp was found in the cecum. The polyp was sessile. The       polyp was removed with a cold biopsy forceps. Resection and retrieval       were complete. Estimated blood loss was minimal.      Two sessile polyps were found in the ascending colon. The polyps were 1       to 2 mm in size. These polyps were removed with a cold biopsy forceps.       Resection and retrieval were complete. Estimated blood loss was minimal.      A 4 to 5 mm polyp was found in the transverse colon. The polyp was       sessile. The polyp was removed with a cold snare. Resection and       retrieval were complete. Estimated blood loss was minimal.      A 4 to 5 mm polyp was found in the descending colon. The polyp was       sessile. The polyp was removed with a cold snare. Resection and       retrieval were complete. Estimated blood loss was minimal.       Five sessile polyps were found in the descending colon. The polyps were       1 to 2 mm in size. These polyps were removed with a cold biopsy forceps.       Resection and retrieval were complete. Estimated blood loss was minimal.      Two sessile polyps were found in the sigmoid colon. The polyps were 1 to       2 mm in size. These polyps were removed with a cold biopsy forceps.       Resection and retrieval were complete. Estimated blood loss was minimal.      Two sessile polyps were found in the sigmoid colon. The polyps were 3 to       4 mm in size. These polyps were removed with a cold snare. Resection and       retrieval were complete. Estimated blood loss was minimal.      A 3 to 4 mm polyp was found in the rectum. The polyp was sessile. The       polyp was removed with a cold snare. Resection and retrieval were       complete. Estimated blood loss was minimal.      The exam was otherwise without abnormality on direct and retroflexion       views. Impression:            - The examined portion of the ileum was normal.                        - Diverticulosis in the left colon.                        - Non-bleeding internal hemorrhoids.                        - One 1 to 2 mm polyp in the cecum, removed with a                         cold biopsy forceps. Resected  and retrieved.                        - Two 1 to 2 mm polyps in the ascending colon, removed                         with a cold biopsy forceps. Resected and retrieved.                        - One 4 to 5 mm polyp in the transverse colon, removed                         with a cold snare. Resected and retrieved.                        - One 4 to 5 mm polyp in the descending colon, removed                         with a cold snare. Resected and retrieved.                        - Five 1 to 2 mm polyps in the descending colon,                         removed with a cold biopsy forceps. Resected and                          retrieved.                        - Two 1 to 2 mm polyps in the sigmoid colon, removed                         with a cold biopsy forceps. Resected and retrieved.                        - Two 3 to 4 mm polyps in the sigmoid colon, removed                         with a cold snare. Resected and retrieved.                        - One 3 to 4 mm polyp in the rectum, removed with a                         cold snare. Resected and retrieved.                        - The examination was otherwise normal on direct and                         retroflexion views. Recommendation:        - Discharge patient to home.                        - Resume previous diet.                        -  Continue present medications.                        - No aspirin, ibuprofen, naproxen, or other                         non-steroidal anti-inflammatory drugs after polyp                         removal.                        - Await pathology results.                        - Repeat colonoscopy in 1 year for surveillance of                         multiple polyps.                        - Return to referring physician as previously                         scheduled.                        - The findings and recommendations were discussed with                         the patient. Procedure Code(s):     --- Professional ---                        [phone], Colonoscopy, flexible; with removal of                         tumor(s), polyp(s), or other lesion(s) by snare                         technique                        45380, 9, Colonoscopy, flexible; with biopsy, single                         or multiple Diagnosis Code(s):     --- Professional ---                        Z86.010, Personal history of colonic polyps                        K64.0, First degree hemorrhoids                        K63.5, Polyp of colon                        K62.1, Rectal polyp                        Z80.0, Family history of malignant  neoplasm of  digestive organs                        K57.30, Diverticulosis of large intestine without                         perforation or abscess without bleeding CPT copyright 2019 American [employer] Association. All rights reserved. The codes documented in this report are preliminary and upon coder review may  be revised to meet current compliance requirements. Attending Participation:      I personally performed the entire procedure. Volney American, DO Annamaria Helling DO, DO 04/26/2021 12:22:34 PM This report has been signed electronically. Number of Addenda: 0 Note Initiated On: 04/26/2021 11:07 AM Scope Withdrawal Time: 0 hours 29 minutes 51 seconds  Total Procedure Duration: 0 hours 34 minutes 11 seconds  Estimated Blood Loss:  Estimated blood loss was minimal.      [facility]

## 2021-04-26 NOTE — Transfer of Care (Signed)
Immediate Anesthesia Transfer of Care Note  Patient: Brian Huffman.  Procedure(s) Performed: COLONOSCOPY  Patient Location: PACU  Anesthesia Type:General  Level of Consciousness: awake and alert   Airway & Oxygen Therapy: Patient Spontanous Breathing  Post-op Assessment: Report given to RN and Post -op Vital signs reviewed and stable  Post vital signs: Reviewed and stable  Last Vitals:  Vitals Value Taken Time  BP 95/57 04/26/21 1219  Temp    Pulse 70 04/26/21 1220  Resp 13 04/26/21 1220  SpO2 93 % 04/26/21 1220  Vitals shown include unvalidated device data.  Last Pain:  Vitals:   04/26/21 1015  TempSrc: Temporal  PainSc: 0-No pain         Complications: No notable events documented.

## 2021-04-26 NOTE — Anesthesia Preprocedure Evaluation (Signed)
Anesthesia Evaluation  Patient identified by MRN, date of birth, ID band Patient awake    Reviewed: Allergy & Precautions, NPO status , Patient's Chart, lab work & pertinent test results  History of Anesthesia Complications Negative for: history of anesthetic complications  Airway Mallampati: III  TM Distance: <3 FB Neck ROM: full    Dental  (+) Chipped, Missing, Poor Dentition   Pulmonary neg shortness of breath,  Signs and symptoms suggestive of sleep apnea     Pulmonary exam normal        Cardiovascular hypertension, + angina Normal cardiovascular exam     Neuro/Psych  Neuromuscular disease negative psych ROS   GI/Hepatic Neg liver ROS, GERD  Controlled,  Endo/Other  negative endocrine ROSdiabetesHypothyroidism   Renal/GU negative Renal ROS  negative genitourinary   Musculoskeletal   Abdominal   Peds  Hematology negative hematology ROS (+)   Anesthesia Other Findings Past Medical History: No date: Allergic genetic state No date: Family hx of colon cancer No date: Gout No date: HLD (hyperlipidemia) No date: Hypertension No date: Hypothyroidism No date: Obesity No date: Trigeminal neuralgia No date: Trigeminal neuralgia No date: Vertigo  Past Surgical History: 2002, 2003, 2007, 2010, 2015.: COLONOSCOPY W/ POLYPECTOMY     Comment:  adenomatous polyps 01/10/2018: COLONOSCOPY WITH PROPOFOL; N/A     Comment:  Procedure: COLONOSCOPY WITH PROPOFOL;  Surgeon: Manya Silvas, MD;  Location: [facility]COPY;  Service:               Endoscopy;  Laterality: N/A;  BMI    Body Mass Index: 39.94 kg/m      Reproductive/Obstetrics negative OB ROS                             Anesthesia Physical Anesthesia Plan  ASA: 3  Anesthesia Plan: General   Post-op Pain Management:    Induction: Intravenous  PONV Risk Score and Plan: Propofol infusion and TIVA  Airway  Management Planned: Natural Airway and Nasal Cannula  Additional Equipment:   Intra-op Plan:   Post-operative Plan:   Informed Consent: I have reviewed the patients History and Physical, chart, labs and discussed the procedure including the risks, benefits and alternatives for the proposed anesthesia with the patient or authorized representative who has indicated his/her understanding and acceptance.     Dental Advisory Given  Plan Discussed with: Anesthesiologist, CRNA and Surgeon  Anesthesia Plan Comments: (Patient consented for risks of anesthesia including but not limited to:  - adverse reactions to medications - risk of airway placement if required - damage to eyes, teeth, lips or other oral mucosa - nerve damage due to positioning  - sore throat or hoarseness - Damage to heart, brain, nerves, lungs, other parts of body or loss of life  Patient voiced understanding.)        Anesthesia Quick Evaluation

## 2021-04-26 NOTE — Anesthesia Postprocedure Evaluation (Signed)
Anesthesia Post Note  Patient: Brian Huffman.  Procedure(s) Performed: COLONOSCOPY  Patient location during evaluation: Endoscopy Anesthesia Type: General Level of consciousness: awake and alert Pain management: pain level controlled Vital Signs Assessment: post-procedure vital signs reviewed and stable Respiratory status: spontaneous breathing, nonlabored ventilation, respiratory function stable and patient connected to nasal cannula oxygen Cardiovascular status: blood pressure returned to baseline and stable Postop Assessment: no apparent nausea or vomiting Anesthetic complications: no   No notable events documented.   Last Vitals:  Vitals:   04/26/21 1218 04/26/21 1228  BP: (!) 95/57 99/63  Pulse: 69 64  Resp: 16 14  Temp: (!) 35.8 C   SpO2: 94% 95%    Last Pain:  Vitals:   04/26/21 1228  TempSrc:   PainSc: Asleep                 Precious Haws Tonae Livolsi

## 2021-04-26 NOTE — Interval H&P Note (Signed)
History and Physical Interval Note: Preprocedure H&P from 04/26/21  was reviewed and there was no interval change after seeing and examining the patient.  Written consent was obtained from the patient after discussion of risks, benefits, and alternatives. Patient has consented to proceed with Colonoscopy with possible intervention   04/26/2021 11:30 AM  Brian Huffman.  has presented today for surgery, with the diagnosis of Screen for colon cancer (Z12.11) Gastroesophageal reflux disease, unspecified whether esophagitis present (K21.9).  The various methods of treatment have been discussed with the patient and family. After consideration of risks, benefits and other options for treatment, the patient has consented to  Procedure(s): COLONOSCOPY (N/A) as a surgical intervention.  The patient's history has been reviewed, patient examined, no change in status, stable for surgery.  I have reviewed the patient's chart and labs.  Questions were answered to the patient's satisfaction.     Brian Huffman

## 2021-04-28 LAB — SURGICAL PATHOLOGY

## 2022-02-02 ENCOUNTER — Encounter: Payer: Self-pay | Admitting: Emergency Medicine

## 2022-02-02 ENCOUNTER — Emergency Department: Payer: No Typology Code available for payment source

## 2022-02-02 ENCOUNTER — Emergency Department
Admission: EM | Admit: 2022-02-02 | Discharge: 2022-02-02 | Disposition: A | Discharge status: Home / Self Care | Payer: No Typology Code available for payment source | Attending: Emergency Medicine | Admitting: Emergency Medicine

## 2022-02-02 ENCOUNTER — Other Ambulatory Visit: Payer: Self-pay

## 2022-02-02 DIAGNOSIS — Z950 Presence of cardiac pacemaker: Secondary | ICD-10-CM | POA: Insufficient documentation

## 2022-02-02 DIAGNOSIS — M79604 Pain in right leg: Secondary | ICD-10-CM | POA: Diagnosis present

## 2022-02-02 DIAGNOSIS — I10 Essential (primary) hypertension: Secondary | ICD-10-CM | POA: Diagnosis not present

## 2022-02-02 DIAGNOSIS — E039 Hypothyroidism, unspecified: Secondary | ICD-10-CM | POA: Diagnosis not present

## 2022-02-02 DIAGNOSIS — M5431 Sciatica, right side: Secondary | ICD-10-CM | POA: Insufficient documentation

## 2022-02-02 DIAGNOSIS — Z79899 Other long term (current) drug therapy: Secondary | ICD-10-CM | POA: Diagnosis not present

## 2022-02-02 MED ORDER — HYDROCODONE-ACETAMINOPHEN 5-325 MG PO TABS
1.0000 | ORAL_TABLET | Freq: Once | ORAL | Status: AC
  Administered 2022-02-02: 1 via ORAL
  Filled 2022-02-02: qty 1

## 2022-02-02 MED ORDER — HYDROCODONE-ACETAMINOPHEN 5-325 MG PO TABS: 1.0000 | ORAL_TABLET | Freq: Four times a day (QID) | ORAL | 0 refills | Status: DC | PRN

## 2022-02-02 MED ORDER — ONDANSETRON 4 MG PO TBDP: 4.0000 mg | ORAL_TABLET | Freq: Four times a day (QID) | ORAL | 0 refills | Status: DC | PRN

## 2022-02-02 MED ORDER — ONDANSETRON 4 MG PO TBDP
4.0000 mg | ORAL_TABLET | Freq: Once | ORAL | Status: AC
  Administered 2022-02-02: 4 mg via ORAL
  Filled 2022-02-02: qty 1

## 2022-02-02 MED ORDER — DOCUSATE SODIUM 100 MG PO CAPS: 100.0000 mg | ORAL_CAPSULE | Freq: Two times a day (BID) | ORAL | 0 refills | Status: AC

## 2022-02-02 MED ORDER — DOCUSATE SODIUM 100 MG PO CAPS: 100.0000 mg | ORAL_CAPSULE | Freq: Two times a day (BID) | ORAL | 0 refills | Status: DC

## 2022-02-02 NOTE — Discharge Instructions (Addendum)
You are being provided a prescription for opiates (also known as narcotics) for pain control.  Opiates can be addictive and should only be used when absolutely necessary for pain control when other alternatives do not work.  We recommend you only use them for the recommended amount of time and only as prescribed.  Please do not take with other sedative medications or alcohol.  Please do not drive, operate machinery, make important decisions while taking opiates.  Please note that these medications can be addictive and have high abuse potential.  Patients can become addicted to narcotics after only taking them for a few days.  Please keep these medications locked away from children, teenagers or any family members with history of substance abuse.  Narcotic pain medicine may also make you constipated.  You may use over-the-counter medications such as MiraLAX, Colace to prevent constipation.  If you become constipated, you may use over-the-counter enemas as needed.  Itching and nausea are also common side effects of narcotic pain medication.  If you develop uncontrolled vomiting or a rash, please stop these medications and seek medical care.   Steps to find a Primary Care Provider (PCP):  Call [phone] or [phone] to access "[facility] Find a Doctor Service."  2.  You may also go on the [facility] website at [email or web address]   

## 2022-02-02 NOTE — ED Triage Notes (Signed)
Pt to ED from home c/o right leg pain from buttocks radiating down right leg to bottom of foot over last week.  Denies injury or falls.  No obvious swelling to leg.

## 2022-02-02 NOTE — ED Provider Notes (Signed)
[facility] Provider Note    Event Date/Time   First MD Initiated Contact with Patient 02/02/22 [phone]     (approximate)   History   Leg Pain   HPI  Brian Zuver. is a 76 y.o. male with history of hypertension, hyperlipidemia, hypothyroidism, obesity, trigeminal neuralgia, pacemaker who presents to the emergency department with pain in the right buttock that radiates down the leg that started when he was twisting and bending over to get something out of the car a few days ago.  States he was seen at urgent care and told this was sciatica and given prednisone but states it has not given him relief yet.  He has pain with walking.  No falls.  Denies any back pain, numbness, tingling, focal weakness, bowel or bladder incontinence, fever.  No previous back surgeries or epidural injections.  Has never had sciatic pain before.  No calf tenderness or calf swelling.   History provided by patient and family.    Past Medical History:  Diagnosis Date   Allergic genetic state    Family hx of colon cancer    Gout    HLD (hyperlipidemia)    Hypertension    Hypothyroidism    Obesity    Trigeminal neuralgia    Trigeminal neuralgia    Vertigo     Past Surgical History:  Procedure Laterality Date   COLONOSCOPY N/A 04/26/2021   Procedure: COLONOSCOPY;  Surgeon: Annamaria Helling, DO;  Location: [facility] ENDOSCOPY;  Service: Gastroenterology;  Laterality: N/A;   COLONOSCOPY W/ POLYPECTOMY  2002, 2003, 2007, 2010, 2015.   adenomatous polyps   COLONOSCOPY WITH PROPOFOL N/A 01/10/2018   Procedure: COLONOSCOPY WITH PROPOFOL;  Surgeon: Manya Silvas, MD;  Location: [facility] ENDOSCOPY;  Service: Endoscopy;  Laterality: N/A;   INSERT / REPLACE / REMOVE PACEMAKER      MEDICATIONS:  Prior to Admission medications   Medication Sig Start Date End Date Taking? Authorizing Provider  albuterol (PROVENTIL HFA;VENTOLIN HFA) 108 (90 Base) MCG/ACT inhaler Inhale 2 puffs  into the lungs every 6 (six) hours as needed for wheezing or shortness of breath. 11/06/16   Epifanio Lesches, MD  allopurinol (ZYLOPRIM) 100 MG tablet Take 1 tablet by mouth daily. 09/18/16   [provider]  amLODipine (NORVASC) 5 MG tablet Take 5 mg by mouth daily.    [provider]  atorvastatin (LIPITOR) 10 MG tablet Take 10 mg by mouth daily.    [provider]  azelastine (ASTELIN) 0.1 % nasal spray Place 2 sprays into both nostrils 2 (two) times daily. Use in each nostril as directed    [provider]  B Complex Vitamins (VITAMIN B COMPLEX PO) Take by mouth daily.    [provider]  benzonatate (TESSALON) 100 MG capsule Take 100 mg by mouth 3 (three) times daily as needed for cough.    [provider]  candesartan (ATACAND) 8 MG tablet Take 1 tablet by mouth 2 (two) times daily. 10/27/16   [provider]  carbamazepine (TEGRETOL XR) 100 MG 12 hr tablet Take 100 mg by mouth 4 (four) times daily.    [provider]  cetirizine (ZYRTEC) 10 MG tablet Take 10 mg by mouth daily.    [provider]  chlorpheniramine-HYDROcodone (TUSSIONEX PENNKINETIC ER) 10-8 MG/5ML SUER Take 5 mLs by mouth at bedtime as needed for cough. 05/02/18   Merlyn Lot, MD  Cholecalciferol (VITAMIN D3) 250 MCG (10000 UT) TABS Take 1 tablet  by mouth daily.    [provider]  Coenzyme Q10 100 MG TABS Take 1 tablet by mouth daily.    [provider]  dimenhyDRINATE (DRAMAMINE PO) Take by mouth. For itchiness or help with sleep    [provider]  ezetimibe (ZETIA) 10 MG tablet Take 5 mg by mouth daily.    [provider]  fluticasone (FLONASE) 50 MCG/ACT nasal spray Place 2 sprays into both nostrils daily.    [provider]  furosemide (LASIX) 20 MG tablet Take 40 mg by mouth daily.    [provider]  gabapentin (NEURONTIN) 100 MG capsule Take 100 mg by mouth 3 (three) times daily.     [provider]  guaiFENesin-codeine 100-10 MG/5ML syrup Take 10 mLs by mouth 3 (three) times daily as needed for cough. 12/28/16   Vaughan Basta, MD  levothyroxine (SYNTHROID, LEVOTHROID) 175 MCG tablet Take 175 mcg by mouth daily before breakfast.    [provider]  meclizine (ANTIVERT) 25 MG tablet Take 25 mg by mouth 3 (three) times daily as needed for dizziness.    [provider]  memantine (NAMENDA) 5 MG tablet Take 5 mg by mouth 2 (two) times daily. Patient not taking: Reported on 04/26/2021    [provider]  milk thistle 175 MG tablet Take 175 mg by mouth daily.    [provider]  Multiple Vitamin (MULTIVITAMIN) tablet Take 1 tablet by mouth daily.    [provider]  omeprazole (PRILOSEC) 20 MG capsule Take 20 mg by mouth 2 (two) times daily before a meal.    [provider]  ondansetron (ZOFRAN-ODT) 4 MG disintegrating tablet Take 4 mg by mouth every 8 (eight) hours as needed for nausea or vomiting.    [provider]  Potassium 99 MG TABS Take by mouth daily.    [provider]  pravastatin (PRAVACHOL) 10 MG tablet Take 10 mg by mouth daily.    [provider]  predniSONE (DELTASONE) 20 MG tablet 3 tabs po once day 1, then 2 tabs po qd x 2 days, then 1 tab po qd x 2 days, then half a tab po qd x 2 days Patient not taking: Reported on 01/10/2018 04/21/17   Norval Gable, MD  promethazine (PHENERGAN) 25 MG tablet Take 25 mg by mouth every 6 (six) hours as needed for nausea or vomiting.    [provider]  ranitidine (ZANTAC) 300 MG tablet Take 300 mg by mouth 3 times/day as needed-between meals & bedtime.  Patient not taking: Reported on 04/26/2021    [provider]  rosuvastatin (CRESTOR) 10 MG tablet Take 10 mg by mouth at bedtime.    [provider]  Spacer/Aero-Holding Chambers (AEROCHAMBER MV) inhaler Use as instructed 05/02/18   Merlyn Lot, MD   tamsulosin (FLOMAX) 0.4 MG CAPS capsule Take 0.4 mg by mouth daily.    [provider]  tiotropium (SPIRIVA) 18 MCG inhalation capsule Place 1 capsule (18 mcg total) into inhaler and inhale daily. 12/28/16   Vaughan Basta, MD  topiramate (TOPAMAX) 25 MG tablet Take 25 mg by mouth 2 (two) times daily.    [provider]  traZODone (DESYREL) 50 MG tablet Take 50 mg by mouth at bedtime.    [provider]  valsartan (DIOVAN) 320 MG tablet Take 320 mg by mouth daily.    [provider]    Physical Exam   Triage Vital Signs: ED Triage Vitals  Enc Vitals  Group     BP 02/02/22 0219 (!) 167/84     Pulse Rate 02/02/22 0219 83     Resp 02/02/22 0219 16     Temp 02/02/22 0219 98 F (36.7 C)     Temp Source 02/02/22 0219 Oral     SpO2 02/02/22 0219 98 %     Weight 02/02/22 0218 240 lb (108.9 kg)     Height 02/02/22 0218 '5\' 5"'$  (1.651 m)     Head Circumference --      Peak Flow --      Pain Score 02/02/22 0218 6     Pain Loc --      Pain Edu? --      Excl. in [place]? --     Most recent vital signs: Vitals:   02/02/22 0219  BP: (!) 167/84  Pulse: 83  Resp: 16  Temp: 98 F (36.7 C)  SpO2: 98%    CONSTITUTIONAL: Alert and oriented and responds appropriately to questions.  Elderly, obese, chronically ill-appearing HEAD: Normocephalic, atraumatic EYES: Conjunctivae clear, pupils appear equal, sclera nonicteric ENT: normal nose; moist mucous membranes NECK: Supple, normal ROM CARD: RRR; S1 and S2 appreciated; no murmurs, no clicks, no rubs, no gallops RESP: Normal chest excursion without splinting or tachypnea; breath sounds clear and equal bilaterally; no wheezes, no rhonchi, no rales, no hypoxia or respiratory distress, speaking full sentences ABD/GI: Normal bowel sounds; non-distended; soft, non-tender, no rebound, no guarding, no peritoneal signs BACK: The back appears normal, no midline spinal tenderness, step-off or deformity, no skin  redness, warmth, ecchymosis, soft tissue swelling EXT: Patient has no tenderness to palpation over the right hip and no deformity, leg length discrepancy but does have pain with internal rotation.  He has full range of motion in all joints of the right leg.  There is no calf tenderness or calf swelling compartments are soft.  He has a 2+ right DP pulse.  Normal sensation in his right leg.  No bony deformity. SKIN: Normal color for age and race; warm; no rash on exposed skin NEURO: Moves all extremities equally, normal speech, normal sensation diffusely, 5/5 strength in all muscle groups of the right lower extremity, normal speech, no saddle anesthesia, no clonus PSYCH: The patient's mood and manner are appropriate.   ED Results / Procedures / Treatments   LABS: (all labs ordered are listed, but only abnormal results are displayed) Labs Reviewed - No data to display   EKG:  RADIOLOGY: My personal review and interpretation of imaging: X-ray is unremarkable other than osteoarthritis.  I have personally reviewed all radiology reports.   DG Hip Unilat W or Wo Pelvis 2-3 Views Right  Result Date: 02/02/2022 CLINICAL DATA:  76 year old male with history of right-sided hip pain. EXAM: DG HIP (WITH OR WITHOUT PELVIS) 2-3V RIGHT COMPARISON:  No priors. FINDINGS: Three views of the bony pelvis and the right hip demonstrate no acute displaced fracture of the bony pelvic ring. Bilateral proximal femurs as visualized appear intact, and the right femoral head is properly located. There is joint space narrowing, subchondral sclerosis and osteophyte formation in the hip joints bilaterally, indicative of mild-to-moderate osteoarthritis. IMPRESSION: 1. No acute radiographic abnormality of the bony pelvis or the right hip. 2. Mild-to-moderate bilateral hip joint osteoarthritis. Electronically Signed   By: Vinnie Langton M.D.   On: 02/02/2022 06:39     PROCEDURES:  Critical Care performed:  No      Procedures    IMPRESSION / MDM /  ASSESSMENT AND PLAN / ED COURSE  I reviewed the triage vital signs and the nursing notes.    Patient here with right hip/buttock pain that radiates all the way down the right leg.    DIFFERENTIAL DIAGNOSIS (includes but not limited to):   Suspect patient has sciatica, lumbar radiculopathy.  Doubt hip fracture, septic arthritis, gout, cauda equina, epidural abscess or hematoma, discitis or osteomyelitis, transverse myelitis, spinal fracture.   Patient's presentation is most consistent with acute complicated illness / injury requiring diagnostic workup.   PLAN: We will give pain medication here.  Will obtain x-ray of the right hip given his age and pain with internal rotation.   MEDICATIONS GIVEN IN ED: Medications  HYDROcodone-acetaminophen (NORCO/VICODIN) 5-325 MG per tablet 1 tablet (1 tablet Oral Given 02/02/22 0617)  ondansetron (ZOFRAN-ODT) disintegrating tablet 4 mg (4 mg Oral Given 02/02/22 0615)     ED COURSE: X-ray reviewed and interpreted by myself and the radiologist and shows osteoarthritis but no other acute abnormality.  Patient's pain has improved and he is able to ambulate in the room without difficulty and without having to use his cane.  Will discharge with prescription of Vicodin to have recommended he continue his prednisone as prescribed by urgent care.  He has a PCP for close follow-up.  At this time, I do not feel there is any life-threatening condition present. I reviewed all nursing notes, vitals, pertinent previous records.  All lab and urine results, EKGs, imaging ordered have been independently reviewed and interpreted by myself.  I reviewed all available radiology reports from any imaging ordered this visit.  Based on my assessment, I feel the patient is safe to be discharged home without further emergent workup and can continue workup as an outpatient as needed. Discussed all findings, treatment plan as well as  usual and customary return precautions.  They verbalize understanding and are comfortable with this plan.  Outpatient follow-up has been provided as needed.  All questions have been answered.    CONSULTS:  none   OUTSIDE RECORDS REVIEWED:  Reviewed last PCP note 01/27/22 for right thigh pain.       FINAL CLINICAL IMPRESSION(S) / ED DIAGNOSES   Final diagnoses:  Sciatica of right side     Rx / DC Orders   ED Discharge Orders          Ordered    HYDROcodone-acetaminophen (NORCO/VICODIN) 5-325 MG tablet  Every 6 hours PRN,   Status:  Discontinued        02/02/22 0701    ondansetron (ZOFRAN-ODT) 4 MG disintegrating tablet  Every 6 hours PRN,   Status:  Discontinued        02/02/22 0701    docusate sodium (COLACE) 100 MG capsule  2 times daily,   Status:  Discontinued        02/02/22 0701    docusate sodium (COLACE) 100 MG capsule  2 times daily        02/02/22 0711    HYDROcodone-acetaminophen (NORCO/VICODIN) 5-325 MG tablet  Every 6 hours PRN        02/02/22 0711    ondansetron (ZOFRAN-ODT) 4 MG disintegrating tablet  Every 6 hours PRN        02/02/22 0737             Note:  This document was prepared using Dragon voice recognition software and may include unintentional dictation errors.   Rubel Heckard, Delice Bison, DO 02/02/22 [phone]

## 2022-12-01 ENCOUNTER — Encounter: Payer: Self-pay | Admitting: Gastroenterology

## 2022-12-07 ENCOUNTER — Encounter: Payer: Self-pay | Admitting: Gastroenterology

## 2022-12-08 ENCOUNTER — Encounter: Admission: RE | Payer: Self-pay | Source: Home / Self Care

## 2022-12-08 ENCOUNTER — Ambulatory Visit: Admission: RE | Admit: 2022-12-08 | Payer: Medicare Other | Source: Home / Self Care | Admitting: Gastroenterology

## 2022-12-08 SURGERY — COLONOSCOPY
Anesthesia: General

## 2023-02-24 ENCOUNTER — Encounter: Payer: Self-pay | Admitting: Gastroenterology

## 2023-03-06 ENCOUNTER — Encounter: Payer: Self-pay | Admitting: Gastroenterology

## 2023-03-10 ENCOUNTER — Encounter: Payer: Self-pay | Admitting: Gastroenterology

## 2023-03-13 ENCOUNTER — Encounter: Payer: Self-pay | Admitting: Certified Registered Nurse Anesthetist

## 2023-03-13 ENCOUNTER — Ambulatory Visit: Admission: RE | Admit: 2023-03-13 | Payer: Medicare Other | Source: Home / Self Care | Admitting: Gastroenterology

## 2023-03-13 SURGERY — COLONOSCOPY WITH PROPOFOL
Anesthesia: General

## 2023-07-18 ENCOUNTER — Encounter: Payer: Self-pay | Admitting: Emergency Medicine

## 2023-07-18 ENCOUNTER — Inpatient Hospital Stay

## 2023-07-18 ENCOUNTER — Emergency Department

## 2023-07-18 ENCOUNTER — Other Ambulatory Visit: Payer: Self-pay

## 2023-07-18 ENCOUNTER — Inpatient Hospital Stay
Admission: EM | Admit: 2023-07-18 | Discharge: 2023-07-20 | DRG: 638 | Disposition: A | Discharge status: Home Health | Attending: Osteopathic Medicine | Admitting: Osteopathic Medicine

## 2023-07-18 DIAGNOSIS — N184 Chronic kidney disease, stage 4 (severe): Secondary | ICD-10-CM | POA: Diagnosis present

## 2023-07-18 DIAGNOSIS — Z888 Allergy status to other drugs, medicaments and biological substances status: Secondary | ICD-10-CM | POA: Diagnosis not present

## 2023-07-18 DIAGNOSIS — E873 Alkalosis: Secondary | ICD-10-CM | POA: Diagnosis present

## 2023-07-18 DIAGNOSIS — E8729 Other acidosis: Secondary | ICD-10-CM | POA: Insufficient documentation

## 2023-07-18 DIAGNOSIS — M542 Cervicalgia: Secondary | ICD-10-CM | POA: Diagnosis present

## 2023-07-18 DIAGNOSIS — Z794 Long term (current) use of insulin: Secondary | ICD-10-CM

## 2023-07-18 DIAGNOSIS — E1110 Type 2 diabetes mellitus with ketoacidosis without coma: Secondary | ICD-10-CM | POA: Diagnosis not present

## 2023-07-18 DIAGNOSIS — Z6839 Body mass index (BMI) 39.0-39.9, adult: Secondary | ICD-10-CM

## 2023-07-18 DIAGNOSIS — Z881 Allergy status to other antibiotic agents status: Secondary | ICD-10-CM

## 2023-07-18 DIAGNOSIS — M79672 Pain in left foot: Secondary | ICD-10-CM | POA: Diagnosis present

## 2023-07-18 DIAGNOSIS — E039 Hypothyroidism, unspecified: Secondary | ICD-10-CM | POA: Diagnosis present

## 2023-07-18 DIAGNOSIS — Z833 Family history of diabetes mellitus: Secondary | ICD-10-CM

## 2023-07-18 DIAGNOSIS — E785 Hyperlipidemia, unspecified: Secondary | ICD-10-CM | POA: Diagnosis present

## 2023-07-18 DIAGNOSIS — B353 Tinea pedis: Secondary | ICD-10-CM | POA: Diagnosis present

## 2023-07-18 DIAGNOSIS — E1122 Type 2 diabetes mellitus with diabetic chronic kidney disease: Secondary | ICD-10-CM | POA: Diagnosis present

## 2023-07-18 DIAGNOSIS — Z880 Allergy status to penicillin: Secondary | ICD-10-CM

## 2023-07-18 DIAGNOSIS — E66812 Obesity, class 2: Secondary | ICD-10-CM | POA: Diagnosis present

## 2023-07-18 DIAGNOSIS — Z79899 Other long term (current) drug therapy: Secondary | ICD-10-CM | POA: Diagnosis not present

## 2023-07-18 DIAGNOSIS — I959 Hypotension, unspecified: Secondary | ICD-10-CM | POA: Diagnosis not present

## 2023-07-18 DIAGNOSIS — E8881 Metabolic syndrome: Secondary | ICD-10-CM | POA: Insufficient documentation

## 2023-07-18 DIAGNOSIS — I129 Hypertensive chronic kidney disease with stage 1 through stage 4 chronic kidney disease, or unspecified chronic kidney disease: Secondary | ICD-10-CM | POA: Diagnosis present

## 2023-07-18 DIAGNOSIS — Z79890 Hormone replacement therapy (postmenopausal): Secondary | ICD-10-CM | POA: Diagnosis not present

## 2023-07-18 DIAGNOSIS — E1140 Type 2 diabetes mellitus with diabetic neuropathy, unspecified: Secondary | ICD-10-CM | POA: Diagnosis present

## 2023-07-18 DIAGNOSIS — I10 Essential (primary) hypertension: Secondary | ICD-10-CM | POA: Diagnosis present

## 2023-07-18 DIAGNOSIS — R296 Repeated falls: Secondary | ICD-10-CM | POA: Diagnosis present

## 2023-07-18 DIAGNOSIS — Z860101 Personal history of adenomatous and serrated colon polyps: Secondary | ICD-10-CM

## 2023-07-18 DIAGNOSIS — Z823 Family history of stroke: Secondary | ICD-10-CM

## 2023-07-18 DIAGNOSIS — S0990XA Unspecified injury of head, initial encounter: Secondary | ICD-10-CM | POA: Diagnosis present

## 2023-07-18 DIAGNOSIS — R7989 Other specified abnormal findings of blood chemistry: Secondary | ICD-10-CM | POA: Diagnosis present

## 2023-07-18 DIAGNOSIS — G4700 Insomnia, unspecified: Secondary | ICD-10-CM | POA: Diagnosis present

## 2023-07-18 DIAGNOSIS — Z800 Family history of malignant neoplasm of digestive organs: Secondary | ICD-10-CM

## 2023-07-18 DIAGNOSIS — N400 Enlarged prostate without lower urinary tract symptoms: Secondary | ICD-10-CM | POA: Insufficient documentation

## 2023-07-18 DIAGNOSIS — R54 Age-related physical debility: Secondary | ICD-10-CM | POA: Diagnosis present

## 2023-07-18 DIAGNOSIS — M79671 Pain in right foot: Secondary | ICD-10-CM | POA: Diagnosis present

## 2023-07-18 DIAGNOSIS — Z91013 Allergy to seafood: Secondary | ICD-10-CM

## 2023-07-18 DIAGNOSIS — W19XXXA Unspecified fall, initial encounter: Secondary | ICD-10-CM | POA: Diagnosis present

## 2023-07-18 DIAGNOSIS — J45909 Unspecified asthma, uncomplicated: Secondary | ICD-10-CM | POA: Diagnosis present

## 2023-07-18 LAB — GLUCOSE, CAPILLARY
Glucose-Capillary: 169 mg/dL — ABNORMAL HIGH (ref 70–99)
Glucose-Capillary: 192 mg/dL — ABNORMAL HIGH (ref 70–99)
Glucose-Capillary: 190 mg/dL — ABNORMAL HIGH (ref 70–99)
Glucose-Capillary: 188 mg/dL — ABNORMAL HIGH (ref 70–99)
Glucose-Capillary: 212 mg/dL — ABNORMAL HIGH (ref 70–99)
Glucose-Capillary: 210 mg/dL — ABNORMAL HIGH (ref 70–99)
Glucose-Capillary: 214 mg/dL — ABNORMAL HIGH (ref 70–99)
Glucose-Capillary: 199 mg/dL — ABNORMAL HIGH (ref 70–99)
Glucose-Capillary: 216 mg/dL — ABNORMAL HIGH (ref 70–99)

## 2023-07-18 LAB — CBC WITH DIFFERENTIAL/PLATELET
Abs Immature Granulocytes: 0.02 10*3/uL (ref 0.00–0.07)
Basophils Absolute: 0 10*3/uL (ref 0.0–0.1)
Basophils Relative: 1 %
Eosinophils Absolute: 0 10*3/uL (ref 0.0–0.5)
Eosinophils Relative: 1 %
HCT: 45.3 % (ref 39.0–52.0)
Hemoglobin: 15.6 g/dL (ref 13.0–17.0)
Immature Granulocytes: 0 %
Lymphocytes Relative: 30 %
Lymphs Abs: 1.7 10*3/uL (ref 0.7–4.0)
MCH: 33.1 pg (ref 26.0–34.0)
MCHC: 34.4 g/dL (ref 30.0–36.0)
MCV: 96 fL (ref 80.0–100.0)
Monocytes Absolute: 0.8 10*3/uL (ref 0.1–1.0)
Monocytes Relative: 13 %
Neutro Abs: 3.3 10*3/uL (ref 1.7–7.7)
Neutrophils Relative %: 55 %
Platelets: 97 10*3/uL — ABNORMAL LOW (ref 150–400)
RBC: 4.72 MIL/uL (ref 4.22–5.81)
RDW: 11.9 % (ref 11.5–15.5)
WBC: 5.9 10*3/uL (ref 4.0–10.5)
nRBC: 0 % (ref 0.0–0.2)

## 2023-07-18 LAB — CBG MONITORING, ED
Glucose-Capillary: 549 mg/dL (ref 70–99)
Glucose-Capillary: 426 mg/dL — ABNORMAL HIGH (ref 70–99)
Glucose-Capillary: 442 mg/dL — ABNORMAL HIGH (ref 70–99)
Glucose-Capillary: 294 mg/dL — ABNORMAL HIGH (ref 70–99)
Glucose-Capillary: 143 mg/dL — ABNORMAL HIGH (ref 70–99)

## 2023-07-18 LAB — BASIC METABOLIC PANEL WITH GFR
Anion gap: 12 (ref 5–15)
Anion gap: 10 (ref 5–15)
BUN: 18 mg/dL (ref 8–23)
BUN: 16 mg/dL (ref 8–23)
CO2: 23 mmol/L (ref 22–32)
CO2: 23 mmol/L (ref 22–32)
Calcium: 9 mg/dL (ref 8.9–10.3)
Calcium: 8.4 mg/dL — ABNORMAL LOW (ref 8.9–10.3)
Chloride: 101 mmol/L (ref 98–111)
Chloride: 103 mmol/L (ref 98–111)
Creatinine, Ser: 1.01 mg/dL (ref 0.61–1.24)
Creatinine, Ser: 0.82 mg/dL (ref 0.61–1.24)
GFR, Estimated: >60 mL/min (ref >60)
GFR, Estimated: >60 mL/min (ref >60)
Glucose, Bld: 207 mg/dL — ABNORMAL HIGH (ref 70–99)
Glucose, Bld: 200 mg/dL — ABNORMAL HIGH (ref 70–99)
Potassium: 3.7 mmol/L (ref 3.5–5.1)
Potassium: 3.8 mmol/L (ref 3.5–5.1)
Sodium: 136 mmol/L (ref 135–145)
Sodium: 136 mmol/L (ref 135–145)

## 2023-07-18 LAB — PHOSPHORUS: Phosphorus: 1.1 mg/dL — ABNORMAL LOW (ref 2.5–4.6)

## 2023-07-18 LAB — BLOOD GAS, VENOUS
Acid-base deficit: 3.2 mmol/L — ABNORMAL HIGH (ref 0.0–2.0)
Bicarbonate: 18.9 mmol/L — ABNORMAL LOW (ref 20.0–28.0)
O2 Saturation: 74.2 %
Patient temperature: 37
pCO2, Ven: 26 mmHg — ABNORMAL LOW (ref 44–60)
pH, Ven: 7.47 — ABNORMAL HIGH (ref 7.25–7.43)
pO2, Ven: 41 mmHg (ref 32–45)

## 2023-07-18 LAB — COMPREHENSIVE METABOLIC PANEL WITH GFR
ALT: 27 U/L (ref 0–44)
AST: 48 U/L — ABNORMAL HIGH (ref 15–41)
Albumin: 4 g/dL (ref 3.5–5.0)
Alkaline Phosphatase: 77 U/L (ref 38–126)
Anion gap: 22 — ABNORMAL HIGH (ref 5–15)
BUN: 21 mg/dL (ref 8–23)
CO2: 18 mmol/L — ABNORMAL LOW (ref 22–32)
Calcium: 9.4 mg/dL (ref 8.9–10.3)
Chloride: 91 mmol/L — ABNORMAL LOW (ref 98–111)
Creatinine, Ser: 1.38 mg/dL — ABNORMAL HIGH (ref 0.61–1.24)
GFR, Estimated: 52 mL/min — ABNORMAL LOW (ref >60)
Glucose, Bld: 509 mg/dL (ref 70–99)
Potassium: 5 mmol/L (ref 3.5–5.1)
Sodium: 131 mmol/L — ABNORMAL LOW (ref 135–145)
Total Bilirubin: 1.7 mg/dL — ABNORMAL HIGH (ref 0.0–1.2)
Total Protein: 7 g/dL (ref 6.5–8.1)

## 2023-07-18 LAB — URINALYSIS, ROUTINE W REFLEX MICROSCOPIC
Bacteria, UA: NONE SEEN
Bilirubin Urine: NEGATIVE
Glucose, UA: >=500 mg/dL — AB
Ketones, ur: 80 mg/dL — AB
Leukocytes,Ua: NEGATIVE
Nitrite: NEGATIVE
Protein, ur: NEGATIVE mg/dL
Specific Gravity, Urine: 1.025 (ref 1.005–1.030)
pH: 6 (ref 5.0–8.0)

## 2023-07-18 LAB — TROPONIN I (HIGH SENSITIVITY)
Troponin I (High Sensitivity): 16 ng/L (ref <18)
Troponin I (High Sensitivity): 18 ng/L — ABNORMAL HIGH (ref <18)

## 2023-07-18 LAB — BETA-HYDROXYBUTYRIC ACID
Beta-Hydroxybutyric Acid: 4.83 mmol/L — ABNORMAL HIGH (ref 0.05–0.27)
Beta-Hydroxybutyric Acid: 2.29 mmol/L — ABNORMAL HIGH (ref 0.05–0.27)
Beta-Hydroxybutyric Acid: 2.96 mmol/L — ABNORMAL HIGH (ref 0.05–0.27)

## 2023-07-18 LAB — CK: Total CK: 124 U/L (ref 49–397)

## 2023-07-18 LAB — MRSA NEXT GEN BY PCR, NASAL: MRSA by PCR Next Gen: NOT DETECTED

## 2023-07-18 LAB — MAGNESIUM: Magnesium: 1.8 mg/dL (ref 1.7–2.4)

## 2023-07-18 MED ORDER — LEVOTHYROXINE SODIUM 100 MCG PO TABS
200.0000 ug | ORAL_TABLET | Freq: Every evening | ORAL | Status: DC
  Administered 2023-07-19 – 2023-07-20 (×2): 200 ug via ORAL
  Filled 2023-07-18 (×2): qty 2

## 2023-07-18 MED ORDER — LIVING WELL WITH DIABETES BOOK
Freq: Once | Status: AC
  Filled 2023-07-18 (×2): qty 1

## 2023-07-18 MED ORDER — PANTOPRAZOLE SODIUM 40 MG PO TBEC
40.0000 mg | DELAYED_RELEASE_TABLET | Freq: Every day | ORAL | Status: DC
  Administered 2023-07-19 – 2023-07-20 (×2): 40 mg via ORAL
  Filled 2023-07-18 (×2): qty 1

## 2023-07-18 MED ORDER — TRAZODONE HCL 50 MG PO TABS: 50.0000 mg | ORAL_TABLET | Freq: Every day | ORAL | Status: DC

## 2023-07-18 MED ORDER — ALLOPURINOL 100 MG PO TABS
100.0000 mg | ORAL_TABLET | Freq: Every day | ORAL | Status: DC
  Administered 2023-07-18 – 2023-07-19 (×2): 100 mg via ORAL
  Filled 2023-07-18 (×3): qty 1

## 2023-07-18 MED ORDER — MEMANTINE HCL 5 MG PO TABS: 5.0000 mg | ORAL_TABLET | Freq: Two times a day (BID) | ORAL | Status: DC

## 2023-07-18 MED ORDER — POTASSIUM CHLORIDE CRYS ER 20 MEQ PO TBCR
40.0000 meq | EXTENDED_RELEASE_TABLET | Freq: Once | ORAL | Status: AC
  Administered 2023-07-18: 40 meq via ORAL
  Filled 2023-07-18: qty 2

## 2023-07-18 MED ORDER — SODIUM CHLORIDE 0.9 % IV SOLN: 250.0000 mL | INTRAVENOUS | Status: AC

## 2023-07-18 MED ORDER — MORPHINE SULFATE (PF) 2 MG/ML IV SOLN: 2.0000 mg | INTRAVENOUS | Status: AC | PRN

## 2023-07-18 MED ORDER — TAMSULOSIN HCL 0.4 MG PO CAPS
0.4000 mg | ORAL_CAPSULE | Freq: Every day | ORAL | Status: DC
  Administered 2023-07-18 – 2023-07-20 (×3): 0.4 mg via ORAL
  Filled 2023-07-18 (×3): qty 1

## 2023-07-18 MED ORDER — NOREPINEPHRINE 4 MG/250ML-% IV SOLN: 0.0000 ug/min | INTRAVENOUS | Status: DC

## 2023-07-18 MED ORDER — CHLORHEXIDINE GLUCONATE CLOTH 2 % EX PADS
6.0000 | MEDICATED_PAD | Freq: Every day | CUTANEOUS | Status: DC
  Administered 2023-07-18 – 2023-07-19 (×2): 6 via TOPICAL

## 2023-07-18 MED ORDER — ROSUVASTATIN CALCIUM 10 MG PO TABS
10.0000 mg | ORAL_TABLET | Freq: Every day | ORAL | Status: DC
  Administered 2023-07-18 – 2023-07-19 (×2): 10 mg via ORAL
  Filled 2023-07-18 (×2): qty 1

## 2023-07-18 MED ORDER — DEXTROSE IN LACTATED RINGERS 5 % IV SOLN: INTRAVENOUS | Status: AC

## 2023-07-18 MED ORDER — INSULIN ASPART 100 UNIT/ML IJ SOLN
0.0000 [IU] | Freq: Every day | INTRAMUSCULAR | Status: DC
  Administered 2023-07-19: 5 [IU] via SUBCUTANEOUS
  Filled 2023-07-18: qty 1

## 2023-07-18 MED ORDER — DEXTROSE 50 % IV SOLN: 0.0000 mL | INTRAVENOUS | Status: DC | PRN

## 2023-07-18 MED ORDER — CARBAMAZEPINE ER 100 MG PO TB12
100.0000 mg | ORAL_TABLET | Freq: Four times a day (QID) | ORAL | Status: DC
  Administered 2023-07-18 – 2023-07-20 (×9): 100 mg via ORAL
  Filled 2023-07-18 (×11): qty 1

## 2023-07-18 MED ORDER — GABAPENTIN 100 MG PO CAPS
100.0000 mg | ORAL_CAPSULE | Freq: Three times a day (TID) | ORAL | Status: DC
  Administered 2023-07-18 – 2023-07-20 (×7): 100 mg via ORAL
  Filled 2023-07-18 (×7): qty 1

## 2023-07-18 MED ORDER — INSULIN ASPART 100 UNIT/ML IJ SOLN
0.0000 [IU] | Freq: Three times a day (TID) | INTRAMUSCULAR | Status: DC
  Administered 2023-07-19: 5 [IU] via SUBCUTANEOUS
  Administered 2023-07-19: 9 [IU] via SUBCUTANEOUS
  Administered 2023-07-20: 3 [IU] via SUBCUTANEOUS
  Administered 2023-07-20: 7 [IU] via SUBCUTANEOUS
  Administered 2023-07-20: 9 [IU] via SUBCUTANEOUS
  Filled 2023-07-18 (×6): qty 1

## 2023-07-18 MED ORDER — SODIUM CHLORIDE 0.9 % IV SOLN
30.0000 mmol | Freq: Once | INTRAVENOUS | Status: AC
  Administered 2023-07-18: 30 mmol via INTRAVENOUS
  Filled 2023-07-18: qty 10

## 2023-07-18 MED ORDER — ORAL CARE MOUTH RINSE: 15.0000 mL | OROMUCOSAL | Status: DC | PRN

## 2023-07-18 MED ORDER — LACTATED RINGERS IV BOLUS
500.0000 mL | Freq: Once | INTRAVENOUS | Status: AC
  Administered 2023-07-18: 500 mL via INTRAVENOUS

## 2023-07-18 MED ORDER — IPRATROPIUM-ALBUTEROL 0.5-2.5 (3) MG/3ML IN SOLN: 3.0000 mL | Freq: Four times a day (QID) | RESPIRATORY_TRACT | Status: DC | PRN

## 2023-07-18 MED ORDER — LACTATED RINGERS IV SOLN: INTRAVENOUS | Status: DC

## 2023-07-18 MED ORDER — INSULIN GLARGINE-YFGN 100 UNIT/ML ~~LOC~~ SOLN
10.0000 [IU] | Freq: Every day | SUBCUTANEOUS | Status: DC
  Administered 2023-07-19 (×2): 10 [IU] via SUBCUTANEOUS
  Filled 2023-07-18 (×4): qty 0.1

## 2023-07-18 MED ORDER — HEPARIN SODIUM (PORCINE) 5000 UNIT/ML IJ SOLN
5000.0000 [IU] | Freq: Three times a day (TID) | INTRAMUSCULAR | Status: DC
  Administered 2023-07-18 – 2023-07-20 (×6): 5000 [IU] via SUBCUTANEOUS
  Filled 2023-07-18 (×6): qty 1

## 2023-07-18 MED ORDER — NOREPINEPHRINE 4 MG/250ML-% IV SOLN
2.0000 ug/min | INTRAVENOUS | Status: DC
  Administered 2023-07-18: 2 ug/min via INTRAVENOUS
  Filled 2023-07-18: qty 250

## 2023-07-18 MED ORDER — POTASSIUM CHLORIDE 10 MEQ/100ML IV SOLN
10.0000 meq | INTRAVENOUS | Status: DC
  Administered 2023-07-18 (×2): 10 meq via INTRAVENOUS
  Filled 2023-07-18 (×2): qty 100

## 2023-07-18 MED ORDER — ALBUTEROL SULFATE (2.5 MG/3ML) 0.083% IN NEBU: 2.5000 mg | INHALATION_SOLUTION | Freq: Four times a day (QID) | RESPIRATORY_TRACT | Status: DC | PRN

## 2023-07-18 MED ORDER — K PHOS MONO-SOD PHOS DI & MONO 155-852-130 MG PO TABS
250.0000 mg | ORAL_TABLET | Freq: Once | ORAL | Status: AC
  Administered 2023-07-18: 250 mg via ORAL
  Filled 2023-07-18: qty 1

## 2023-07-18 MED ORDER — INSULIN STARTER KIT- PEN NEEDLES (ENGLISH)
1.0000 | Freq: Once | Status: AC
  Administered 2023-07-19: 1
  Filled 2023-07-18 (×2): qty 1

## 2023-07-18 MED ORDER — INSULIN REGULAR(HUMAN) IN NACL 100-0.9 UT/100ML-% IV SOLN
INTRAVENOUS | Status: DC
  Administered 2023-07-18: 13 [IU]/h via INTRAVENOUS
  Administered 2023-07-19: 3.6 [IU]/h via INTRAVENOUS
  Filled 2023-07-18 (×2): qty 100

## 2023-07-18 MED ORDER — HYDRALAZINE HCL 20 MG/ML IJ SOLN: 5.0000 mg | Freq: Four times a day (QID) | INTRAMUSCULAR | Status: DC | PRN

## 2023-07-18 MED ORDER — IRBESARTAN 150 MG PO TABS
300.0000 mg | ORAL_TABLET | Freq: Every day | ORAL | Status: DC
  Administered 2023-07-19 – 2023-07-20 (×2): 300 mg via ORAL
  Filled 2023-07-18 (×2): qty 2

## 2023-07-18 MED ORDER — SODIUM CHLORIDE 0.9 % IV BOLUS
1000.0000 mL | Freq: Once | INTRAVENOUS | Status: AC
  Administered 2023-07-18: 1000 mL via INTRAVENOUS

## 2023-07-18 NOTE — Assessment & Plan Note (Signed)
Hydralazine 5 mg IV every 6 hours as needed for SBP greater 160, 5 days ordered

## 2023-07-18 NOTE — Assessment & Plan Note (Signed)
 Flomax 0.4 mg daily with supper resumed

## 2023-07-18 NOTE — Progress Notes (Deleted)
 Per MD Dgayli okay for MAP to be below 65 with systolic >90

## 2023-07-18 NOTE — Assessment & Plan Note (Addendum)
 Potassium phosphate 30 mmol in sodium chloride 500 mL liter infusion, 1 dose ordered Phosphorus tablet 250 mg p.o. one-time dose ordered Recheck phosphorus level in the a.m.

## 2023-07-18 NOTE — Assessment & Plan Note (Signed)
 At this time not meeting criteria for acute kidney injury Baseline serum creatinine is 1.2/eGFR 62 from CMP on 02/06/2023 Check BMP every 4 hours per DKA protocol

## 2023-07-18 NOTE — ED Provider Notes (Signed)
 [facility] Provider Note    Event Date/Time   First MD Initiated Contact with Patient 07/18/23 [phone]     (approximate)   History   Hyperglycemia   HPI  Brian Huffman. is a 78 year old male with history of prediabetes, HTN presenting to the emergency department for evaluation after a fall.  Patient reports that yesterday he fell and hit his head, no LOC.  Today, had another fall and hit the back of his head/neck.  No new numbness, tingling, weakness.  Felt generally weak, so he called EMS.  For EMS he was found to have a blood glucose of 546.  Not on any medications for diabetes.  Given 500 cc en route.     Physical Exam   Triage Vital Signs: ED Triage Vitals  Encounter Vitals Group     BP 07/18/23 0848 114/65     Systolic BP Percentile --      Diastolic BP Percentile --      Pulse Rate 07/18/23 0848 90     Resp 07/18/23 0848 18     Temp 07/18/23 0848 97.8 F (36.6 C)     Temp Source 07/18/23 0848 Oral     SpO2 07/18/23 0848 94 %     Weight --      Height --      Head Circumference --      Peak Flow --      Pain Score 07/18/23 0850 7     Pain Loc --      Pain Education --      Exclude from Growth Chart --     Most recent vital signs: Vitals:   07/18/23 0848  BP: 114/65  Pulse: 90  Resp: 18  Temp: 97.8 F (36.6 C)  SpO2: 94%     General: Awake, interactive  CV:  Regular rate, good peripheral perfusion.  Resp:  Mildly labored respirations, lungs clear to auscultation Abd:  Nondistended, soft, nontender to palpation Neuro:  Symmetric facial movement, fluid speech   ED Results / Procedures / Treatments   Labs (all labs ordered are listed, but only abnormal results are displayed) Labs Reviewed  CBC WITH DIFFERENTIAL/PLATELET - Abnormal; Notable for the following components:      Result Value   Platelets 97 (*)    All other components within normal limits  COMPREHENSIVE METABOLIC PANEL WITH GFR - Abnormal; Notable for  the following components:   Sodium 131 (*)    Chloride 91 (*)    CO2 18 (*)    Glucose, Bld 509 (*)    Creatinine, Ser 1.38 (*)    AST 48 (*)    Total Bilirubin 1.7 (*)    GFR, Estimated 52 (*)    Anion gap 22 (*)    All other components within normal limits  CBG MONITORING, ED - Abnormal; Notable for the following components:   Glucose-Capillary 549 (*)    All other components within normal limits  CBG MONITORING, ED - Abnormal; Notable for the following components:   Glucose-Capillary 442 (*)    All other components within normal limits  BETA-HYDROXYBUTYRIC ACID  BETA-HYDROXYBUTYRIC ACID  BETA-HYDROXYBUTYRIC ACID  BETA-HYDROXYBUTYRIC ACID  BLOOD GAS, VENOUS  URINALYSIS, ROUTINE W REFLEX MICROSCOPIC     EKG EKG independently reviewed interpreted by myself (ER attending) demonstrates:  EKG demonstrates paced rhythm at a rate of 80, PR 176, QRS 148, QTc 482, no acute ST changes  RADIOLOGY Imaging independently reviewed and interpreted by  myself demonstrates:  CT head without acute bleed CT C-spine without acute fracture CXR without focal consolidation Ankle x-[employer] without acute fracture  PROCEDURES:  Critical Care performed: Yes, see critical care procedure note(s)  CRITICAL CARE Performed by: Trinna Post   Total critical care time: 31 minutes  Critical care time was exclusive of separately billable procedures and treating other patients.  Critical care was necessary to treat or prevent imminent or life-threatening deterioration.  Critical care was time spent personally by me on the following activities: development of treatment plan with patient and/or surrogate as well as nursing, discussions with consultants, evaluation of patient's response to treatment, examination of patient, obtaining history from patient or surrogate, ordering and performing treatments and interventions, ordering and review of laboratory studies, ordering and review of radiographic studies, pulse  oximetry and re-evaluation of patient's condition.   Procedures   MEDICATIONS ORDERED IN ED: Medications  insulin regular, human (MYXREDLIN) 100 units/ 100 mL infusion (13 Units/hr Intravenous New Bag/Given 07/18/23 1155)  lactated ringers infusion ( Intravenous New Bag/Given 07/18/23 1130)  dextrose 5 % in lactated ringers infusion (has no administration in time range)  dextrose 50 % solution 0-50 mL (has no administration in time range)  potassium chloride 10 mEq in 100 mL IVPB (10 mEq Intravenous New Bag/Given 07/18/23 1147)  sodium chloride 0.9 % bolus 1,000 mL (0 mLs Intravenous Stopped 07/18/23 1147)  sodium chloride 0.9 % bolus 1,000 mL (1,000 mLs Intravenous New Bag/Given 07/18/23 1127)     IMPRESSION / MDM / ASSESSMENT AND PLAN / ED COURSE  I reviewed the triage vital signs and the nursing notes.  Differential diagnosis includes, but is not limited to, intracranial bleed, spine fracture, no evidence of thoracoabdominal trauma, soft tissue injury, DKA, hyperglycemia without DKA  Patient's presentation is most consistent with acute presentation with potential threat to life or bodily function.  78 year old male presenting with weakness, multiple recent falls found to have elevated blood glucose with EMS.  Will obtain blood work here to further evaluate.  Ordered for additional fluids.  Clinical Course as of 07/18/23 1156  Tue Jul 18, 2023  1025 Comprehensive metabolic panel(!!) CMP demonstrates hyperglycemia with glucose of 509 with low bicarb at 18 and elevated anion gap at 22, consistent with DKA.  Insulin drip ordered.  Awaiting radiology read of imaging studies.  If these are without significant traumatic injury, anticipate admission here for further management of patient's DKA. [NR]  1143 CT Head Wo Contrast CT head, C-spine, x-rays without acute traumatic injury or other acute findings.  Will reach out to hospitalist team for admission in the setting of DKA.  [NR]    Clinical  Course User Index [NR] Trinna Post, MD     FINAL CLINICAL IMPRESSION(S) / ED DIAGNOSES   Final diagnoses:  Diabetic ketoacidosis without coma associated with type 2 diabetes mellitus (HCC)  Closed head injury, initial encounter  Acute neck pain     Rx / DC Orders   ED Discharge Orders     None        Note:  This document was prepared using Dragon voice recognition software and may include unintentional dictation errors.   Trinna Post, MD 07/18/23 [phone]

## 2023-07-18 NOTE — Assessment & Plan Note (Signed)
 With truncal obesity

## 2023-07-18 NOTE — Assessment & Plan Note (Addendum)
 With respiratory alkalosis Continue treatment per above

## 2023-07-18 NOTE — Assessment & Plan Note (Signed)
 Does not appear to be in acute exacerbation at this time DuoNebs every 6 hours as needed for shortness of breath and wheezing, 3 days ordered

## 2023-07-18 NOTE — Hospital Course (Signed)
 Brian Huffman, [place]. is a 78 year old male with history of prediabetes, hyperlipidemia, hypertension, hypothyroid, neuropathy, insomnia, CKD stage IV, adenomatous polyp of the colon, trigeminal neuralgia, who presents to the emergency apartment for chief concerns of falling 2 times over the last 2 days.  Per ED documentation, EMS brought patient to the ED for hyperglycemia with a CBG of 546.  Per ED documentation, sodium chloride 500 cc were given on route by EMS. Vitals in the ED showed temperature of 98.7, respiration rate of 18, heart rate 90, blood pressure 114/65, SpO2 94% on room air.  Serum sodium is 131, potassium 5.0, chloride 91, bicarb 18, BUN of 21, serum creatinine 1.38, EGFR 52, nonfasting glucose 509, WBC 5.9, hemoglobin 15.6, platelets of 97.  Anion gap was elevated at 22.  AST was 48.  ALT is 27.  VBG, beta hydroxybutyrate, UA have been ordered by EDP and pending collection at the time of this dictation.  ED treatment: Insulin per Endo tool, sodium chloride 2 L bolus, and sodium chloride infusion at 125 mL/h were initiated.

## 2023-07-18 NOTE — Plan of Care (Signed)
   Problem: Metabolic: Goal: Ability to maintain appropriate glucose levels will improve Outcome: Progressing

## 2023-07-18 NOTE — Progress Notes (Signed)
 [facility] Progress Note  Addendum at approximately 18:39: Received message from RN that patient's BP remains hypotensive.  I presented to bedside.  Patient is able to tell me his first and last name, age, location, current calendar year.  He denies being in pain including chest pain or chest discomfort.  He denies shortness of breath, abdominal pain, new pain.  He reports the left arm pain where there was potassium infusion is no longer bothering him.  Vitals at bedside: RR 18, heart rate 80, blood pressure 86/48, MAP of 58, SpO2 of 96% on room air.  # Hypotension with MAP less than 65 - Levophed gtt. initiated on admission, goal MAP greater than 65 - Bed changed from stepdown to ICU level - Discussed with PCCM team.  # Elevated troponin-initial high sensitive troponin was 16 and on repeat is 18, mildly elevated - Query demand ischemia in setting of hypotension however given patient with diabetes and polyuria polydipsia for over 2 months, cardiac etiology cannot be excluded at this time - Will trend a third high sensitive troponin, timed for 2200 hrs. on admission - If positive with increasing delta, we will order a complete echo and consider heparin per pharmacy -EKG ordered and reviewed, unchanged from prior EKG  # Hypophosphorus-replace by p.o. and IV supplement, recheck phosphorus in the a.m.  Discussed the above with nursing.   Dr. Sedalia Muta

## 2023-07-18 NOTE — Assessment & Plan Note (Signed)
 Levothyroxine 200 mcg daily resumed

## 2023-07-18 NOTE — H&P (Addendum)
 History and Physical   Brian Huffman. ZOX:[id number] DOB: 1945-11-06 DOA: 07/18/2023  PCP: Garry Heater, MD  Outpatient Specialists: Dr. Fransisca Kaufmann, [facility] Patient coming from: home via EMS  I have personally briefly reviewed patient's old medical records in [facility] EMR.  Chief Concern: Increased falling over the last two days  HPI: Brian Huffman, Brian Huffman. is a 78 year old male with history of prediabetes, hyperlipidemia, hypertension, hypothyroid, neuropathy, insomnia, CKD stage IV, adenomatous polyp of the colon, trigeminal neuralgia, who presents to the emergency apartment for chief concerns of falling 2 times over the last 2 days.  Per ED documentation, EMS brought patient to the ED for hyperglycemia with a CBG of 546.  Per ED documentation, sodium chloride 500 cc were given on route by EMS. Vitals in the ED showed temperature of 98.7, respiration rate of 18, heart rate 90, blood pressure 114/65, SpO2 94% on room air.  Serum sodium is 131, potassium 5.0, chloride 91, bicarb 18, BUN of 21, serum creatinine 1.38, EGFR 52, nonfasting glucose 509, WBC 5.9, hemoglobin 15.6, platelets of 97.  Anion gap was elevated at 22.  AST was 48.  ALT is 27.  VBG, beta hydroxybutyrate, UA have been ordered by EDP and pending collection at the time of this dictation.  ED treatment: Insulin per Endo tool, sodium chloride 2 L bolus, and sodium chloride infusion at 125 mL/h were initiated. -------------------------------------------- At bedside, patient able to tell me his first and last name, age at the 89, current calendar year, current location.  He reports that he has had polyuria and polydipsia for the past 2 months.  He has fallen 3 times in the last 2 days and has hit his head and denies loss of consciousness.  He denies chest pain, nausea, vomiting, diarrhea, blood in his stool, blood in his urine, dysuria.  He endorses shortness of breath.  He reports the thing that is  bothering him the most is his left arm is hurting from the IV.  Social history: He lives at home with his wife.  He denies tobacco, EtOH, recreational drug use.  He is retired.  ROS: Constitutional: no weight change, no fever ENT/Mouth: no sore throat, no rhinorrhea Eyes: no eye pain, no vision changes Cardiovascular: no chest pain, + dyspnea,  no edema, no palpitations Respiratory: no cough, no sputum, no wheezing Gastrointestinal: no nausea, no vomiting, no diarrhea, no constipation Genitourinary: no urinary incontinence, no dysuria, no hematuria, + polyuria, + polydipsia Musculoskeletal: no arthralgias, no myalgias Skin: no skin lesions, no pruritus, Neuro: + weakness, no loss of consciousness, no syncope Psych: no anxiety, no depression, + decrease appetite Heme/Lymph: no bruising, no bleeding  ED Course: Discussed with EDP, patient requiring hospitalization for chief concerns of suspected DKA in a patient with medical history of prediabetes.  Assessment/Plan  Principal Problem:   DKA (diabetic ketoacidosis) (HCC) Active Problems:   Reactive airway disease   Essential hypertension   Elevated serum creatinine   Hypophosphatemia   Morbid obesity (HCC)   Hypothyroidism   BPH (benign prostatic hyperplasia)   Metabolic syndrome   High anion gap metabolic acidosis   Hypotension   Assessment and Plan:  * DKA (diabetic ketoacidosis) (HCC) Status post sodium chloride 2 L bolus per EDP Continue with LR infusion Status post potassium chloride 10 mill equivalent, IV, 2 doses ordered by EDP Continue insulin per Endo tool Check A1c in the a.m. Check high-sensitivity troponin, magnesium, phosphorus urgent on admission Continue n.p.o. until patient receives  long-acting insulin bridge Admit to stepdown, inpatient  Reactive airway disease Does not appear to be in acute exacerbation at this time DuoNebs every 6 hours as needed for shortness of breath and wheezing, 3 days  ordered  Essential hypertension Hydralazine 5 mg IV every 6 hours as needed for SBP greater 160, 5 days ordered  Hypotension Received message from nursing with 3 blood pressure reading 110/46, 100/51, 89/54 LR 500 mL liter bolus one-time dose ordered  High anion gap metabolic acidosis With respiratory alkalosis Continue treatment per above  Metabolic syndrome With truncal obesity  BPH (benign prostatic hyperplasia) Flomax 0.4 mg daily with supper resumed  Hypothyroidism Levothyroxine 200 mcg daily resumed  Morbid obesity (HCC) This complicates overall care and prognosis.   Hypophosphatemia Potassium phosphate 30 mmol in sodium chloride 500 mL liter infusion, 1 dose ordered Phosphorus tablet 250 mg p.o. one-time dose ordered Recheck phosphorus level in the a.m.  Elevated serum creatinine At this time not meeting criteria for acute kidney injury Baseline serum creatinine is 1.2/eGFR 62 from CMP on 02/06/2023 Check BMP every 4 hours per DKA protocol  Chart reviewed.   DVT prophylaxis: Heparin 5000 units subcutaneous every 8 hours Code Status: Full code Diet:n.p.o. Family Communication: Updated spouse and sister, passed at bedside with patient's permission Disposition Plan: Pending clinical course Consults called: Diabetes educator Admission status: Stepdown, inpatient  Past Medical History:  Diagnosis Date   Allergic genetic state    Family hx of colon cancer    Gout    HLD (hyperlipidemia)    Hypertension    Hypothyroidism    Obesity    Trigeminal neuralgia    Trigeminal neuralgia    Vertigo    Past Surgical History:  Procedure Laterality Date   COLONOSCOPY N/A 04/26/2021   Procedure: COLONOSCOPY;  Surgeon: Jaynie Collins, DO;  Location: [facility] ENDOSCOPY;  Service: Gastroenterology;  Laterality: N/A;   COLONOSCOPY W/ POLYPECTOMY  2002, 2003, 2007, 2010, 2015.   adenomatous polyps   COLONOSCOPY WITH PROPOFOL N/A 01/10/2018   Procedure: COLONOSCOPY WITH  PROPOFOL;  Surgeon: Scot Jun, MD;  Location: [facility] ENDOSCOPY;  Service: Endoscopy;  Laterality: N/A;   INSERT / REPLACE / REMOVE PACEMAKER     Social History:  reports that he has never smoked. He has never used smokeless tobacco. He reports that he does not drink alcohol and does not use drugs.  Allergies  Allergen Reactions   [place] Grass Extract Other (See Comments)    Difficulty breathing   Azithromycin Hives   Other Other (See Comments)    DUST, sneezing, watery eyes   Statins Other (See Comments)    Atorvastatin, simvastatin and pravastatin     Penicillins Rash   Shellfish-Derived Products Rash   Family History  Problem Relation Age of Onset   Stroke Mother    Diabetes Mellitus II Father    Diabetes Mellitus II Sister    Family history: Family history reviewed and not pertinent.  Prior to Admission medications   Medication Sig Start Date End Date Taking? Authorizing Provider  albuterol (PROVENTIL HFA;VENTOLIN HFA) 108 (90 Base) MCG/ACT inhaler Inhale 2 puffs into the lungs every 6 (six) hours as needed for wheezing or shortness of breath. 11/06/16   Katha Hamming, MD  allopurinol (ZYLOPRIM) 100 MG tablet Take 1 tablet by mouth daily. 09/18/16   [provider]  amLODipine (NORVASC) 5 MG tablet Take 5 mg by mouth daily.    [provider]  atorvastatin (LIPITOR) 10 MG tablet Take  10 mg by mouth daily.    [provider]  azelastine (ASTELIN) 0.1 % nasal spray Place 2 sprays into both nostrils 2 (two) times daily. Use in each nostril as directed    [provider]  B Complex Vitamins (VITAMIN B COMPLEX PO) Take by mouth daily.    [provider]  benzonatate (TESSALON) 100 MG capsule Take 100 mg by mouth 3 (three) times daily as needed for cough.    [provider]  candesartan (ATACAND) 8 MG tablet Take 1 tablet by mouth 2 (two) times daily. 10/27/16   [provider]  carbamazepine (TEGRETOL XR) 100 MG  12 hr tablet Take 100 mg by mouth 4 (four) times daily.    [provider]  cetirizine (ZYRTEC) 10 MG tablet Take 10 mg by mouth daily.    [provider]  chlorpheniramine-HYDROcodone (TUSSIONEX PENNKINETIC ER) 10-8 MG/5ML SUER Take 5 mLs by mouth at bedtime as needed for cough. 05/02/18   Willy Eddy, MD  Cholecalciferol (VITAMIN D3) 250 MCG (10000 UT) TABS Take 1 tablet by mouth daily.    [provider]  Coenzyme Q10 100 MG TABS Take 1 tablet by mouth daily.    [provider]  dimenhyDRINATE (DRAMAMINE PO) Take by mouth. For itchiness or help with sleep    [provider]  ezetimibe (ZETIA) 10 MG tablet Take 5 mg by mouth daily.    [provider]  fluticasone (FLONASE) 50 MCG/ACT nasal spray Place 2 sprays into both nostrils daily.    [provider]  furosemide (LASIX) 20 MG tablet Take 40 mg by mouth daily.    [provider]  gabapentin (NEURONTIN) 100 MG capsule Take 100 mg by mouth 3 (three) times daily.    [provider]  guaiFENesin-codeine 100-10 MG/5ML syrup Take 10 mLs by mouth 3 (three) times daily as needed for cough. 12/28/16   Altamese Dilling, MD  HYDROcodone-acetaminophen (NORCO/VICODIN) 5-325 MG tablet Take 1 tablet by mouth every 6 (six) hours as needed. 02/02/22   Ward, Layla Maw, DO  levothyroxine (SYNTHROID, LEVOTHROID) 175 MCG tablet Take 175 mcg by mouth daily before breakfast.    [provider]  meclizine (ANTIVERT) 25 MG tablet Take 25 mg by mouth 3 (three) times daily as needed for dizziness.    [provider]  memantine (NAMENDA) 5 MG tablet Take 5 mg by mouth 2 (two) times daily.    [provider]  milk thistle 175 MG tablet Take 175 mg by mouth daily.    [provider]  Multiple Vitamin (MULTIVITAMIN) tablet Take 1 tablet by mouth daily.    [provider]  omeprazole (PRILOSEC) 20 MG capsule Take 20 mg by mouth 2 (two) times  daily before a meal.    [provider]  ondansetron (ZOFRAN-ODT) 4 MG disintegrating tablet Take 4 mg by mouth every 8 (eight) hours as needed for nausea or vomiting.    [provider]  ondansetron (ZOFRAN-ODT) 4 MG disintegrating tablet Take 1 tablet (4 mg total) by mouth every 6 (six) hours as needed for nausea or vomiting. 02/02/22   Ward, Layla Maw, DO  Potassium 99 MG TABS Take by mouth daily.    [provider]  pravastatin (PRAVACHOL) 10 MG tablet Take 10 mg by mouth daily.    [provider]  predniSONE (DELTASONE) 20 MG tablet 3 tabs po once day 1, then 2 tabs po qd x 2 days, then 1 tab po qd  x 2 days, then half a tab po qd x 2 days 04/21/17   Payton Mccallum, MD  promethazine (PHENERGAN) 25 MG tablet Take 25 mg by mouth every 6 (six) hours as needed for nausea or vomiting.    [provider]  ranitidine (ZANTAC) 300 MG tablet Take 300 mg by mouth 3 times/day as needed-between meals & bedtime.    [provider]  rosuvastatin (CRESTOR) 10 MG tablet Take 10 mg by mouth at bedtime.    [provider]  Spacer/Aero-Holding Chambers (AEROCHAMBER MV) inhaler Use as instructed 05/02/18   Willy Eddy, MD  tamsulosin (FLOMAX) 0.4 MG CAPS capsule Take 0.4 mg by mouth daily.    [provider]  tiotropium (SPIRIVA) 18 MCG inhalation capsule Place 1 capsule (18 mcg total) into inhaler and inhale daily. 12/28/16   Altamese Dilling, MD  topiramate (TOPAMAX) 25 MG tablet Take 25 mg by mouth 2 (two) times daily.    [provider]  traZODone (DESYREL) 50 MG tablet Take 50 mg by mouth at bedtime.    [provider]  valsartan (DIOVAN) 320 MG tablet Take 320 mg by mouth daily.    [provider]   Physical Exam: Vitals:   07/18/23 1550 07/18/23 1600 07/18/23 1630 07/18/23 1700  BP: 105/62 (!) 110/46 (!) 100/51 (!) 89/54  Pulse: 80 80 76 78  Resp: 19 16 15 19   Temp:  97.8 F (36.6 C)    TempSrc:   Oral    SpO2: 97% 96% 96% 97%  Weight:  106.9 kg    Height:  5\' 5"  (1.651 m)     Constitutional: appears age appropriate, frail, chronically ill Eyes: PERRL, lids and conjunctivae normal ENMT: Mucous membranes are moist. Posterior pharynx clear of any exudate or lesions. Age-appropriate dentition. Hearing appropriate Neck: normal, supple, no masses, no thyromegaly Respiratory: clear to auscultation bilaterally, no wheezing, no crackles. Normal respiratory effort. No accessory muscle use.  Cardiovascular: Regular rate and rhythm, no murmurs / rubs / gallops. No extremity edema. 2+ pedal pulses. No carotid bruits.  Abdomen: morbidly obese abdomen, no tenderness, no masses palpated, no hepatosplenomegaly. Bowel sounds positive.  Musculoskeletal: no clubbing / cyanosis. No joint deformity upper and lower extremities. Good ROM, no contractures, no atrophy. Normal muscle tone.  Skin: no rashes, lesions, ulcers. No induration Neurologic: Sensation intact. Strength 5/5 in all 4.  Psychiatric: Normal judgment and insight. Alert and oriented x 3. Normal mood.   EKG: independently reviewed, showing atrial sensed with rate 80, qtc 482  Chest x-ray on Admission: I personally reviewed and I agree with radiologist reading as below.  DG Ankle Complete Right Result Date: 07/18/2023 CLINICAL DATA:  Fall. EXAM: RIGHT ANKLE - COMPLETE 3+ VIEW COMPARISON:  None Available. FINDINGS: No acute fracture or dislocation. No aggressive osseous lesion. Ankle mortise appears intact. Mild degenerative changes of the imaged joints. No focal soft tissue swelling. No radiopaque foreign bodies. IMPRESSION: No acute osseous abnormality of the right ankle. Electronically Signed   By: Jules Schick M.D.   On: 07/18/2023 10:57   DG Chest 2 View Result Date: 07/18/2023 CLINICAL DATA:  Fall. EXAM: CHEST - 2 VIEW COMPARISON:  05/02/2018. FINDINGS: Bilateral lung fields are clear. Bilateral costophrenic angles are clear. Normal  cardio-mediastinal silhouette. There is a left sided 2-lead pacemaker. No acute osseous abnormalities. The soft tissues are within normal limits. IMPRESSION: No active cardiopulmonary disease. Electronically Signed   By: Jules Schick M.D.   On: 07/18/2023 10:56  CT Head Wo Contrast Result Date: 07/18/2023 CLINICAL DATA:  Provided history: Neck trauma. Head trauma, minor. Additional history provided: two falls in the last two days. EXAM: CT HEAD WITHOUT CONTRAST CT CERVICAL SPINE WITHOUT CONTRAST TECHNIQUE: Multidetector CT imaging of the head and cervical spine was performed following the standard protocol without intravenous contrast. Multiplanar CT image reconstructions of the cervical spine were also generated. RADIATION DOSE REDUCTION: This exam was performed according to the departmental dose-optimization program which includes automated exposure control, adjustment of the mA and/or kV according to patient size and/or use of iterative reconstruction technique. COMPARISON:  None. FINDINGS: CT HEAD FINDINGS Brain: Generalized cerebral atrophy. Patchy and ill-defined hypoattenuation within the cerebral white matter, nonspecific but compatible with mild-to-moderate chronic small vessel ischemic disease. There is no acute intracranial hemorrhage. No demarcated cortical infarct. No extra-axial fluid collection. No evidence of an intracranial mass. No midline shift. Vascular: No hyperdense vessel.  Atherosclerotic calcifications. Skull: No calvarial fracture or aggressive osseous lesion. Sinuses/Orbits: No mass or acute finding within the imaged orbits. Postsurgical appearance of the paranasal sinuses. Mild mucosal thickening within the bilateral maxillary sinuses at the imaged levels (right greater than left). Moderate anterior right ethmoid sinusitis. Mild mucosal thickening within the right frontal sinus. Other: Small left mastoid effusion. CT CERVICAL SPINE FINDINGS Alignment: Dextrocurvature of the cervical  spine. Slight T1-T2 grade 1 anterolisthesis. Skull base and vertebrae: The basion-dental and atlanto-dental intervals are maintained.No evidence of acute fracture to the cervical spine. Soft tissues and spinal canal: No prevertebral soft tissue swelling or visible canal hematoma. Disc levels: Cervical spondylosis with multilevel bulges/central disc protrusions, posterior disc osteophyte complexes, uncovertebral hypertrophy and facet arthropathy. Disc space narrowing is greatest at C4-C5 and C5-C6 (advanced at these levels). No appreciable high-grade spinal canal stenosis. Multilevel bony neural foraminal narrowing. Upper chest: No consolidation within the imaged lung apices. No visible pneumothorax. IMPRESSION: CT head: 1.  No evidence of an acute intracranial abnormality. 2. Parenchymal atrophy and chronic small vessel ischemic disease. 3. Paranasal sinus disease at the imaged levels, as described. 4. Small left mastoid effusion. CT cervical spine: 1. No evidence of an acute cervical spine fracture. 2. Mild T1-T2 grade 1 anterolisthesis. 3. Dextrocurvature of the cervical spine. 4. Cervical spondylosis as described. Electronically Signed   By: Jackey Loge D.O.   On: 07/18/2023 10:30   CT Cervical Spine Wo Contrast Result Date: 07/18/2023 CLINICAL DATA:  Provided history: Neck trauma. Head trauma, minor. Additional history provided: two falls in the last two days. EXAM: CT HEAD WITHOUT CONTRAST CT CERVICAL SPINE WITHOUT CONTRAST TECHNIQUE: Multidetector CT imaging of the head and cervical spine was performed following the standard protocol without intravenous contrast. Multiplanar CT image reconstructions of the cervical spine were also generated. RADIATION DOSE REDUCTION: This exam was performed according to the departmental dose-optimization program which includes automated exposure control, adjustment of the mA and/or kV according to patient size and/or use of iterative reconstruction technique. COMPARISON:   None. FINDINGS: CT HEAD FINDINGS Brain: Generalized cerebral atrophy. Patchy and ill-defined hypoattenuation within the cerebral white matter, nonspecific but compatible with mild-to-moderate chronic small vessel ischemic disease. There is no acute intracranial hemorrhage. No demarcated cortical infarct. No extra-axial fluid collection. No evidence of an intracranial mass. No midline shift. Vascular: No hyperdense vessel.  Atherosclerotic calcifications. Skull: No calvarial fracture or aggressive osseous lesion. Sinuses/Orbits: No mass or acute finding within the imaged orbits. Postsurgical appearance of the paranasal sinuses. Mild mucosal thickening within the bilateral maxillary sinuses at the  imaged levels (right greater than left). Moderate anterior right ethmoid sinusitis. Mild mucosal thickening within the right frontal sinus. Other: Small left mastoid effusion. CT CERVICAL SPINE FINDINGS Alignment: Dextrocurvature of the cervical spine. Slight T1-T2 grade 1 anterolisthesis. Skull base and vertebrae: The basion-dental and atlanto-dental intervals are maintained.No evidence of acute fracture to the cervical spine. Soft tissues and spinal canal: No prevertebral soft tissue swelling or visible canal hematoma. Disc levels: Cervical spondylosis with multilevel bulges/central disc protrusions, posterior disc osteophyte complexes, uncovertebral hypertrophy and facet arthropathy. Disc space narrowing is greatest at C4-C5 and C5-C6 (advanced at these levels). No appreciable high-grade spinal canal stenosis. Multilevel bony neural foraminal narrowing. Upper chest: No consolidation within the imaged lung apices. No visible pneumothorax. IMPRESSION: CT head: 1.  No evidence of an acute intracranial abnormality. 2. Parenchymal atrophy and chronic small vessel ischemic disease. 3. Paranasal sinus disease at the imaged levels, as described. 4. Small left mastoid effusion. CT cervical spine: 1. No evidence of an acute  cervical spine fracture. 2. Mild T1-T2 grade 1 anterolisthesis. 3. Dextrocurvature of the cervical spine. 4. Cervical spondylosis as described. Electronically Signed   By: Jackey Loge D.O.   On: 07/18/2023 10:30   Labs on Admission: I have personally reviewed following labs  CBC: Recent Labs  Lab 07/18/23 0907  WBC 5.9  NEUTROABS 3.3  HGB 15.6  HCT 45.3  MCV 96.0  PLT 97*   Basic Metabolic Panel: Recent Labs  Lab 07/18/23 0907 07/18/23 1116  NA 131*  --   K 5.0  --   CL 91*  --   CO2 18*  --   GLUCOSE 509*  --   BUN 21  --   CREATININE 1.38*  --   CALCIUM 9.4  --   MG  --  1.8  PHOS  --  1.1*   GFR: Estimated Creatinine Clearance: 48.9 mL/min (A) (by C-G formula based on SCr of 1.38 mg/dL (H)).  Liver Function Tests: Recent Labs  Lab 07/18/23 0907  AST 48*  ALT 27  ALKPHOS 77  BILITOT 1.7*  PROT 7.0  ALBUMIN 4.0   CBG: Recent Labs  Lab 07/18/23 1221 07/18/23 1317 07/18/23 1433 07/18/23 1532 07/18/23 1638  GLUCAP 426* 294* 143* 169* 192*   CRITICAL CARE Performed by: Dr. Sedalia Muta  Total critical care time: 32 minutes  Critical care time was exclusive of separately billable procedures and treating other patients.  Critical care was necessary to treat or prevent imminent or life-threatening deterioration.  Critical care was time spent personally by me on the following activities: development of treatment plan with patient and spouse as well as nursing, discussions with consultants, evaluation of patient's response to treatment, examination of patient, obtaining history from patient or surrogate, ordering and performing treatments and interventions, ordering and review of laboratory studies, ordering and review of radiographic studies, pulse oximetry and re-evaluation of patient's condition.  This document was prepared using Dragon Voice Recognition software and may include unintentional dictation errors.  Dr. Sedalia Muta [facility]  If 7PM-7AM, please  contact overnight-coverage provider If 7AM-7PM, please contact day attending provider [email or web address]  07/18/2023, 5:30 PM

## 2023-07-18 NOTE — Inpatient Diabetes Management (Addendum)
 Inpatient Diabetes Program Recommendations  AACE/ADA: New Consensus Statement on Inpatient Glycemic Control   Target Ranges:  Prepandial:   less than 140 mg/dL      Peak postprandial:   less than 180 mg/dL (1-2 hours)      Critically ill patients:  140 - 180 mg/dL    Latest Reference Range & Units 07/18/23 09:01 07/18/23 11:43 07/18/23 12:21 07/18/23 13:17 07/18/23 14:33  Glucose-Capillary 70 - 99 mg/dL 161 (HH) 096 (H) 045 (H) 294 (H) 143 (H)    Latest Reference Range & Units 07/18/23 09:07  CO2 22 - 32 mmol/L 18 (L)  Glucose 70 - 99 mg/dL 409 (HH)  Anion gap 5 - 15  22 (H)   Review of Glycemic Control  Diabetes history: Prediabetes Outpatient Diabetes medications: None Current orders for Inpatient glycemic control: IV insulin  Inpatient Diabetes Program Recommendations:    Insulin: Once acidosis has completely cleared and provider is ready to transition from IV to SQ insulin, please consider ordering Semglee 10 units Q24H (based on 108.9 kg x 0.1 units), CBGs Q4H, and Novolog 0-9 units Q4H.  HbgA1C: Last A1C in Care Everywhere was 6.6% on 02/06/23. Current A1C ordered.  NOTE: Patient with prior hx of prediabetes and not taking any DM medications outpatient. Patient was brought to hospital via EMS due to falls at home and noted to have glucose of 546 mg/dl with EMS. Initial lab glucose 509 mg/dl today at 8:11 am and patient has been started on IV insulin for DKA.   Addendum 07/18/23@14 :25-Talked with patient and wife at bedside. Patient states he has prediabetes hx and sees his PCP for primary care. Patient does not take any DM medications and has never taken any. Patient denies any steroids in the past 2-3 months and states he has not started any new medications in the past 3 months. Patient states that he has been having symptoms of hyperglycemia for few weeks (frequent urination, excessive thirst, and blurry vision). Patient states he actually went to the eye doctor last week because  of the vision changes; he reports he was told he had cataracts and they changed his prescription for his glasses. Discussed how the lens of the eye can swell with hyperglycemia and that the swelling goes down gradually so it may be a few weeks before his vision returns to baseline. Patient is not checking glucose or following a carb modified diet. He notes that he does drink diet drinks, eats a lot of bread, potatoes, and eats ice cream about every night. Discussed that last A1C noted in chart was 6.6% on 02/06/23.  Explained what an A1C is and that current A1C is in process.  Discussed initial glucose of 549 ng/dl on CBG and briefly discussed DKA pathophysiology. Explained that IV insulin is being used to treat DKA and once acidosis has cleared he would transition to SQ insulin. Patient states he hopes he will not have to take insulin. Discussed that his glucose trends, insulin needs, and A1C results will be assessed to determine if he needs insulin at discharge. Informed patient that a Living Well with DM book would be ordered; asked that he start reading (or have wife) read it to him so he can learn more about DM and importance of control. Discussed glucose monitoring, glucose goals, and A1C goals.  Informed patient that our team will follow up with him tomorrow to continue DM education and review insulin if it looks like he will need insulin outpatient. Patient verbalized understanding and  has no questions at this time. Ordered Living Well with DM book, RD consult for diet education, and insulin starter kit  Thanks, Orlando Penner, RN, MSN, CDCES Diabetes Coordinator Inpatient Diabetes Program [phone] (Team Pager from 8am to 5pm)

## 2023-07-18 NOTE — Assessment & Plan Note (Signed)
 -  This complicates overall care and prognosis.

## 2023-07-18 NOTE — ED Triage Notes (Signed)
 Patient presents via [facility] for hyperglycemia from home. Per EMS, pt has fallen twice in last two days but is denying any injury.  Pt with history of chronic nerve and sciatica pain.  CBG 546 for [facility], states he is prediabetic and does not take any meds for diabetes.  500cc NS given en route.

## 2023-07-18 NOTE — Assessment & Plan Note (Signed)
 Status post sodium chloride 2 L bolus per EDP Continue with LR infusion Status post potassium chloride 10 mill equivalent, IV, 2 doses ordered by EDP Continue insulin per Endo tool Check A1c in the a.m. Check high-sensitivity troponin, magnesium, phosphorus urgent on admission Continue n.p.o. until patient receives long-acting insulin bridge Admit to stepdown, inpatient

## 2023-07-18 NOTE — ED Notes (Signed)
Patient provided with warm blanket

## 2023-07-18 NOTE — Assessment & Plan Note (Signed)
 Received message from nursing with 3 blood pressure reading 110/46, 100/51, 89/54 LR 500 mL liter bolus one-time dose ordered

## 2023-07-19 DIAGNOSIS — E1110 Type 2 diabetes mellitus with ketoacidosis without coma: Secondary | ICD-10-CM | POA: Diagnosis not present

## 2023-07-19 LAB — GLUCOSE, CAPILLARY
Glucose-Capillary: 169 mg/dL — ABNORMAL HIGH (ref 70–99)
Glucose-Capillary: 174 mg/dL — ABNORMAL HIGH (ref 70–99)
Glucose-Capillary: 178 mg/dL — ABNORMAL HIGH (ref 70–99)
Glucose-Capillary: 178 mg/dL — ABNORMAL HIGH (ref 70–99)
Glucose-Capillary: 179 mg/dL — ABNORMAL HIGH (ref 70–99)
Glucose-Capillary: 179 mg/dL — ABNORMAL HIGH (ref 70–99)
Glucose-Capillary: 185 mg/dL — ABNORMAL HIGH (ref 70–99)
Glucose-Capillary: 198 mg/dL — ABNORMAL HIGH (ref 70–99)
Glucose-Capillary: 208 mg/dL — ABNORMAL HIGH (ref 70–99)
Glucose-Capillary: 268 mg/dL — ABNORMAL HIGH (ref 70–99)
Glucose-Capillary: 298 mg/dL — ABNORMAL HIGH (ref 70–99)
Glucose-Capillary: 331 mg/dL — ABNORMAL HIGH (ref 70–99)

## 2023-07-19 LAB — BASIC METABOLIC PANEL WITH GFR
Anion gap: 8 (ref 5–15)
BUN: 14 mg/dL (ref 8–23)
CO2: 25 mmol/L (ref 22–32)
Calcium: 8.2 mg/dL — ABNORMAL LOW (ref 8.9–10.3)
Chloride: 102 mmol/L (ref 98–111)
Creatinine, Ser: 0.76 mg/dL (ref 0.61–1.24)
GFR, Estimated: >60 mL/min (ref >60)
Glucose, Bld: 176 mg/dL — ABNORMAL HIGH (ref 70–99)
Potassium: 3.8 mmol/L (ref 3.5–5.1)
Sodium: 135 mmol/L (ref 135–145)

## 2023-07-19 LAB — TROPONIN I (HIGH SENSITIVITY): Troponin I (High Sensitivity): 20 ng/L — ABNORMAL HIGH (ref <18)

## 2023-07-19 LAB — BETA-HYDROXYBUTYRIC ACID: Beta-Hydroxybutyric Acid: 0.67 mmol/L — ABNORMAL HIGH (ref 0.05–0.27)

## 2023-07-19 LAB — PHOSPHORUS: Phosphorus: 3.7 mg/dL (ref 2.5–4.6)

## 2023-07-19 LAB — MAGNESIUM: Magnesium: 1.8 mg/dL (ref 1.7–2.4)

## 2023-07-19 MED ORDER — ENSURE MAX PROTEIN PO LIQD
11.0000 [oz_av] | Freq: Two times a day (BID) | ORAL | Status: DC
  Administered 2023-07-20: 11 [oz_av] via ORAL
  Filled 2023-07-19: qty 330

## 2023-07-19 MED ORDER — ADULT MULTIVITAMIN W/MINERALS CH
1.0000 | ORAL_TABLET | Freq: Every day | ORAL | Status: DC
  Administered 2023-07-20: 1 via ORAL
  Filled 2023-07-19: qty 1

## 2023-07-19 MED ORDER — ASPIRIN-ACETAMINOPHEN-CAFFEINE 250-250-65 MG PO TABS
1.0000 | ORAL_TABLET | Freq: Four times a day (QID) | ORAL | Status: DC | PRN
  Filled 2023-07-19 (×3): qty 1

## 2023-07-19 MED ORDER — ACETAMINOPHEN-CAFFEINE 500-65 MG PO TABS
1.0000 | ORAL_TABLET | Freq: Four times a day (QID) | ORAL | Status: DC | PRN
  Filled 2023-07-19: qty 1

## 2023-07-19 NOTE — Progress Notes (Signed)
 Initial Nutrition Assessment  DOCUMENTATION CODES:   Obesity unspecified  INTERVENTION:   Ensure Max protein supplement po BID, each supplement provides 150kcal and 30g of protein.  MVI po daily   Pt at high refeed risk; recommend monitor potassium, magnesium and phosphorus labs daily until stable  Daily weights   Diabetes diet education   NUTRITION DIAGNOSIS:   Inadequate oral intake related to acute illness as evidenced by per patient/family report.  GOAL:   Patient will meet greater than or equal to 90% of their needs  MONITOR:   PO intake, Supplement acceptance, Labs, Weight trends, I & O's, Skin  REASON FOR ASSESSMENT:   Consult Diet education  ASSESSMENT:   78 year old male with history of prediabetes, hyperlipidemia, hypertension, hypothyroid, neuropathy, insomnia, gout, CKD stage IV, adenomatous polyp of the colon and trigeminal neuralgia who is admitted with DKA.  Met with pt in room today. Pt reports decreased appetite and oral intake for several weeks pta. Pt reports that he has lost ~10lbs. Pt reports his UBW is around 245lbs. Per chart, pt does appear to be down ~16lbs(5%) since November. Pt reports eating fair at breakfast this morning; pt documented to have eaten 60% breakfast. RD discussed with pt the importance of adequate nutrition needed to preserve lean muscle. Pt is agreeable to drinking chocolate and vanilla Ensure in hospital. Pt is at high refeed risk.     Pt provided with diabetes diet education today.   RD provided "Nutrition and Type II Diabetes" handout from the Academy [employer]. Discussed different food groups and their effects on blood sugar, emphasizing carbohydrate-containing foods. Provided list of carbohydrates and recommended serving sizes of common foods.  Discussed importance of controlled and consistent carbohydrate intake throughout the day. Provided examples of ways to balance meals/snacks and encouraged intake of  high-fiber, whole grain complex carbohydrates. Teach back method used.  Medications reviewed and include: allopurinol, heparin, insulin, synthroid, protonix  Labs reviewed: K 3.8 wnl, P 3.7 wnl, Mg 1.8 wnl Cbgs- 268, 179, 178, 178, 174, 198 x 24 hrs  AIC- pending   NUTRITION - FOCUSED PHYSICAL EXAM:  Flowsheet Row Most Recent Value  Orbital Region No depletion  Upper Arm Region No depletion  Thoracic and Lumbar Region No depletion  Buccal Region No depletion  Temple Region No depletion  Clavicle Bone Region No depletion  Clavicle and Acromion Bone Region No depletion  Scapular Bone Region No depletion  Dorsal Hand No depletion  Patellar Region No depletion  Anterior Thigh Region No depletion  Posterior Calf Region No depletion  Edema (RD Assessment) Mild  Hair Reviewed  Eyes Reviewed  Mouth Reviewed  Skin Reviewed  Nails Reviewed   Diet Order:   Diet Order             Diet Carb Modified Fluid consistency: Thin; Room service appropriate? Yes  Diet effective now                  EDUCATION NEEDS:   Education needs have been addressed  Skin:  Skin Assessment: Reviewed RN Assessment  Last BM:  pta  Height:   Ht Readings from Last 1 Encounters:  07/18/23 5\' 5"  (1.651 m)    Weight:   Wt Readings from Last 1 Encounters:  07/18/23 106.9 kg    Ideal Body Weight:  61.8 kg  BMI:  Body mass index is 39.22 kg/m.  Estimated Nutritional Needs:   Kcal:  2000-2300kcal/day  Protein:  100-115g/day  Fluid:  1.7-1.9L/day  Betsey Holiday MS, RD, LDN If unable to be reached, please send secure chat to "RD inpatient" available from 8:00a-4:00p daily

## 2023-07-19 NOTE — Inpatient Diabetes Management (Addendum)
 Inpatient Diabetes Program Recommendations  AACE/ADA: New Consensus Statement on Inpatient Glycemic Control (2015)  Target Ranges:  Prepandial:   less than 140 mg/dL      Peak postprandial:   less than 180 mg/dL (1-2 hours)      Critically ill patients:  140 - 180 mg/dL    Latest Reference Range & Units 07/18/23 09:07  Glucose 70 - 99 mg/dL 960 (HH)  (HH): Data is critically high  Latest Reference Range & Units 07/19/23 00:31 07/19/23 01:31 07/19/23 02:30 07/19/23 03:33 07/19/23 04:27 07/19/23 05:28 07/19/23 06:26 07/19/23 07:30  Glucose-Capillary 70 - 99 mg/dL 454 (H)  IV Insulin Drip Running 179 (H) 185 (H) 169 (H) 198 (H) 174 (H) 178 (H) 178 (H)   10 units Semglee @0803   (H): Data is abnormally high  Admit with: DKA  History: Prediabetes, CKD  Home DM Meds: None  Current Orders: IV Insulin Drip     Semglee 10 units QHS     Novolog Sensitive Correction Scale/ SSI (0-9 units) TID AC + HS   MD- Met w/ pt today.   I provided Insulin pen education and other basic diabetes education with him (wife and sister not present but I did call sister with pt's permission and gave her an update). Pt may do best with a simple diabetes medication regimen at home.  He is willing to take insulin if needed, but, based on my assessment today, it may be best to do once daily Basal insulin + Oral meds for ease.   Still waiting on the A1c results.    Addendum 12pm--Met w/ pt at bedside.  He was awake and alert and able to have a meaningful conversation.  Spoke with pt about new diagnosis.  Explained what an A1C is and how it will guide discharge meds (A1c still not resulted yet), basic pathophysiology of DM Type 2, basic home care, basic diabetes diet nutrition principles, importance of checking CBGs and maintaining good CBG control to prevent long-term and short-term complications.  Reviewed signs and symptoms of hyperglycemia and hypoglycemia and how to treat hypoglycemia at home.  Also reviewed  blood sugar goals and A1c goals for home.  Pt told me he wants to see a new PCP b/c his current PCP is in [place]--Encouraged pt to see his current PCP for now for follow up visit after d/c and then look into finding new PCP.  Pt asked me about a "diabetes doctor"--I told pt there are ENDOs at [facility] clinic and that he or his wife should call the ENDO office to see about making an appt sooner rather than later.  I also called pt's sister Dalvin Clipper (with pt's permission) and gave her an update and reviewed the above info with sister as well.  Sister told me she will bring herself and pt's wife to the hospital sometime between 10-11am tomorrow for further diabetes education.  RNs to provide ongoing basic DM education at bedside with this patient.  Ordered educational booklet, insulin starter kit yesterday.  RD consulted for DM diet education for this patient.  Asked RN to allow pt to give all injections in hospital for practice and asked NT to allow pt to check own fingerstick glucose.    Educated patient on insulin pen use at home.  Reviewed all steps of insulin pen including attachment of needle, 2-unit air shot, dialing up dose, giving injection, rotation of injection sites, removing needle, disposal of sharps, storage of unused insulin, disposal of insulin etc.  Patient able to provide successful return demonstration with lots of prompting.  Reviewed troubleshooting with insulin pen.    Pt will need follow up education on using insulin pen at home if discharged home on insulin.    --Will follow patient during hospitalization--  Ambrose Finland RN, MSN, CDCES Diabetes Coordinator Inpatient Glycemic Control Team Team Pager: [phone] (8a-5p)

## 2023-07-19 NOTE — Progress Notes (Signed)
 PROGRESS NOTE    Alta Corning.   ZOX:[id number] DOB: 12-Sep-1945  DOA: 07/18/2023 Date of Service: 07/19/23 which is hospital day 1  PCP: Garry Heater, MD    Hospital course / significant events:   HPI:  Mr. Brian Huffman, Brian Huffman. is a 78 year old male with history of prediabetes, hyperlipidemia, hypertension, hypothyroid, neuropathy, insomnia, CKD stage IV, adenomatous polyp of the colon, trigeminal neuralgia, who presents to the emergency apartment for chief concerns of falling 2 times over the last 2 days. Per ED documentation, EMS brought patient to the ED for hyperglycemia with a CBG of 546.  Per ED documentation, sodium chloride 500 cc were given on route by EMS.  04/01: admitted to hospitalist service on insulin gtt. Low BP requiring levophed gtt, mild elevation in troponin but no chest pain. EKG unchanged.  04/02: troponin essentially flat and no symptoms, EKG stable. Off insulin gtt, transition to sq.      Consultants:  none  Procedures/Surgeries: none      ASSESSMENT & PLAN:   DKA (diabetic ketoacidosis) - resolved High anion gap metabolic acidosis DM2 w/ hyperglycemia  Bridging to sq insulin A1C pending    Reactive airway disease Does not appear to be in acute exacerbation at this time DuoNebs every 6 hours as needed for shortness of breath and wheezing   Essential hypertension Hypotension - resolved  Hydralazine 5 mg IV every 6 hours as needed for SBP greater 160, 5 days ordered Cont home irbesartan    BPH (benign prostatic hyperplasia) Flomax 0.4 mg daily with supper    Hypothyroidism Levothyroxine 200 mcg daily    Hypophosphatemia Replete as needed Monitor levels   Elevated serum creatinine At this time not meeting criteria for acute kidney injury Baseline serum creatinine is 1.2/eGFR 62 from CMP on 02/06/2023 Check BMP daily   Foot pain on R suspect neuropathy +/- plantar fasciitis  Ambulatory  dysfunction PT/OT Gabapentin Pain control    Metabolic syndrome, with truncal obesity Class 2 obesity based on BMI: Body mass index is 39.22 kg/m.  Underweight - under 18  overweight - 25 to 29 obese - 30 or more Class 1 obesity: BMI of 30.0 to 34 Class 2 obesity: BMI of 35.0 to 39 Class 3 obesity: BMI of 40.0 to 49 Super Morbid Obesity: BMI 50-59 Super-super Morbid Obesity: BMI 60+ Significantly low or high BMI is associated with higher medical risk.  Weight management advised as adjunct to other disease management and risk reduction treatments    DVT prophylaxis: heparin  IV fluids: no continuous IV fluids  Nutrition: carb modified Central lines / other devices: none  Code Status: FULL CODE ACP documentation reviewed: none on file in [facility]  TOC needs: TBD Medical barriers to dispo: glucose stabilization, A1C to guide insulin management. Expected medical readiness for discharge likely tomorrow .              Subjective / Brief ROS:  Patient reports foot pain, feels a bit dizzy on occasion, no lightheaded/presyncope, foot pain precludes ambulation Denies CP/SOB.  Pain controlled.  Denies new weakness.  Tolerating diet.  Reports no concerns w/ urination/defecation.   Family Communication: none at this time pt states his wife will be here later to confirm medications     Objective Findings:  Vitals:   07/19/23 1300 07/19/23 1400 07/19/23 1500 07/19/23 1600  BP:  (!) 122/59 135/66 (!) 136/59  Pulse: 97 91 83 75  Resp: 17 (!) 34 (!) 22 (!) 21  Temp:      TempSrc:      SpO2: 96% 96% 99% 95%  Weight:      Height:        Intake/Output Summary (Last 24 hours) at 07/19/2023 1654 Last data filed at 07/19/2023 1409 Gross per 24 hour  Intake 3274.63 ml  Output 1000 ml  Net 2274.63 ml   Filed Weights   07/18/23 1600  Weight: 106.9 kg    Examination:  Physical Exam Constitutional:      General: He is not in acute distress. Cardiovascular:     Rate  and Rhythm: Normal rate and regular rhythm.  Pulmonary:     Effort: Pulmonary effort is normal.     Breath sounds: Normal breath sounds.  Abdominal:     General: Abdomen is flat.     Palpations: Abdomen is soft.  Musculoskeletal:        General: Tenderness (r plantar mid-foot) present.     Right lower leg: Edema (trace) present.     Left lower leg: Edema (trace at ankles) present.  Skin:    General: Skin is warm and dry.  Neurological:     General: No focal deficit present.     Mental Status: He is alert and oriented to person, place, and time. Mental status is at baseline.  Psychiatric:        Mood and Affect: Mood normal.          Scheduled Medications:   allopurinol  100 mg Oral QHS   carbamazepine  100 mg Oral QID   Chlorhexidine Gluconate Cloth  6 each Topical Daily   gabapentin  100 mg Oral TID   heparin  5,000 Units Subcutaneous Q8H   insulin aspart  0-5 Units Subcutaneous QHS   insulin aspart  0-9 Units Subcutaneous TID WC   insulin glargine-yfgn  10 Units Subcutaneous QHS   irbesartan  300 mg Oral Daily   levothyroxine  200 mcg Oral QPM   [START ON 07/20/2023] multivitamin with minerals  1 tablet Oral Daily   pantoprazole  40 mg Oral Daily   Ensure Max Protein  11 oz Oral BID   rosuvastatin  10 mg Oral QHS   tamsulosin  0.4 mg Oral QPC supper    Continuous Infusions:  sodium chloride Stopped (07/18/23 1902)    PRN Medications:  albuterol, aspirin-acetaminophen-caffeine, dextrose, hydrALAZINE, mouth rinse  Antimicrobials from admission:  Anti-infectives (From admission, onward)    None           Data Reviewed:  I have personally reviewed the following...  CBC: Recent Labs  Lab 07/18/23 0907  WBC 5.9  NEUTROABS 3.3  HGB 15.6  HCT 45.3  MCV 96.0  PLT 97*   Basic Metabolic Panel: Recent Labs  Lab 07/18/23 0907 07/18/23 1116 07/18/23 1902 07/18/23 2251 07/19/23 0242  NA 131*  --  136 136 135  K 5.0  --  3.7 3.8 3.8  CL 91*  --   101 103 102  CO2 18*  --  23 23 25   GLUCOSE 509*  --  207* 200* 176*  BUN 21  --  18 16 14   CREATININE 1.38*  --  1.01 0.82 0.76  CALCIUM 9.4  --  9.0 8.4* 8.2*  MG  --  1.8  --   --  1.8  PHOS  --  1.1*  --   --  3.7   GFR: Estimated Creatinine Clearance: 84.4 mL/min (by C-G formula based on SCr of 0.76  mg/dL). Liver Function Tests: Recent Labs  Lab 07/18/23 0907  AST 48*  ALT 27  ALKPHOS 77  BILITOT 1.7*  PROT 7.0  ALBUMIN 4.0   No results for input(s): "LIPASE", "AMYLASE" in the last 168 hours. No results for input(s): "AMMONIA" in the last 168 hours. Coagulation Profile: No results for input(s): "INR", "PROTIME" in the last 168 hours. Cardiac Enzymes: Recent Labs  Lab 07/18/23 1627  CKTOTAL 124   BNP (last 3 results) No results for input(s): "PROBNP" in the last 8760 hours. HbA1C: No results for input(s): "HGBA1C" in the last 72 hours. CBG: Recent Labs  Lab 07/19/23 0626 07/19/23 0730 07/19/23 0843 07/19/23 1205 07/19/23 1639  GLUCAP 178* 178* 179* 268* 331*   Lipid Profile: No results for input(s): "CHOL", "HDL", "LDLCALC", "TRIG", "CHOLHDL", "LDLDIRECT" in the last 72 hours. Thyroid Function Tests: No results for input(s): "TSH", "T4TOTAL", "FREET4", "T3FREE", "THYROIDAB" in the last 72 hours. Anemia Panel: No results for input(s): "VITAMINB12", "FOLATE", "FERRITIN", "TIBC", "IRON", "RETICCTPCT" in the last 72 hours. Most Recent Urinalysis On File:     Component Value Date/Time   COLORURINE YELLOW (A) 07/18/2023 1546   APPEARANCEUR CLEAR (A) 07/18/2023 1546   LABSPEC 1.025 07/18/2023 1546   PHURINE 6.0 07/18/2023 1546   GLUCOSEU >=500 (A) 07/18/2023 1546   HGBUR MODERATE (A) 07/18/2023 1546   BILIRUBINUR NEGATIVE 07/18/2023 1546   KETONESUR 80 (A) 07/18/2023 1546   PROTEINUR NEGATIVE 07/18/2023 1546   NITRITE NEGATIVE 07/18/2023 1546   LEUKOCYTESUR NEGATIVE 07/18/2023 1546   Sepsis  Labs: @LABRCNTIP (procalcitonin:4,lacticidven:4) Microbiology: Recent Results (from the past 240 hours)  MRSA Next Gen by PCR, Nasal     Status: None   Collection Time: 07/18/23  3:34 PM   Specimen: Nasal Mucosa; Nasal Swab  Result Value Ref Range Status   MRSA by PCR Next Gen NOT DETECTED NOT DETECTED Final    Comment: (NOTE) The GeneXpert MRSA Assay (FDA approved for NASAL specimens only), is one component of a comprehensive MRSA colonization surveillance program. It is not intended to diagnose MRSA infection nor to guide or monitor treatment for MRSA infections. Test performance is not FDA approved in patients less than 28 years old. Performed at [facility], [address], [place], [place] [address]       Radiology Studies last 3 days: DG Ankle Right Port Result Date: 07/18/2023 CLINICAL DATA:  Right ankle pain EXAM: PORTABLE RIGHT ANKLE - 2 VIEW COMPARISON:  07/18/2023 at 9:30 a.m. FINDINGS: No acute fracture or dislocation. Swelling over the lateral malleolus. IMPRESSION: No acute fracture or dislocation. Swelling over the lateral malleolus. Electronically Signed   By: Minerva Fester M.D.   On: 07/18/2023 20:36   DG Ankle Complete Right Result Date: 07/18/2023 CLINICAL DATA:  Fall. EXAM: RIGHT ANKLE - COMPLETE 3+ VIEW COMPARISON:  None Available. FINDINGS: No acute fracture or dislocation. No aggressive osseous lesion. Ankle mortise appears intact. Mild degenerative changes of the imaged joints. No focal soft tissue swelling. No radiopaque foreign bodies. IMPRESSION: No acute osseous abnormality of the right ankle. Electronically Signed   By: Jules Schick M.D.   On: 07/18/2023 10:57   DG Chest 2 View Result Date: 07/18/2023 CLINICAL DATA:  Fall. EXAM: CHEST - 2 VIEW COMPARISON:  05/02/2018. FINDINGS: Bilateral lung fields are clear. Bilateral costophrenic angles are clear. Normal cardio-mediastinal silhouette. There is a left sided 2-lead pacemaker. No acute osseous  abnormalities. The soft tissues are within normal limits. IMPRESSION: No active cardiopulmonary disease. Electronically Signed  By: Jules Schick M.D.   On: 07/18/2023 10:56   CT Head Wo Contrast Result Date: 07/18/2023 CLINICAL DATA:  Provided history: Neck trauma. Head trauma, minor. Additional history provided: two falls in the last two days. EXAM: CT HEAD WITHOUT CONTRAST CT CERVICAL SPINE WITHOUT CONTRAST TECHNIQUE: Multidetector CT imaging of the head and cervical spine was performed following the standard protocol without intravenous contrast. Multiplanar CT image reconstructions of the cervical spine were also generated. RADIATION DOSE REDUCTION: This exam was performed according to the departmental dose-optimization program which includes automated exposure control, adjustment of the mA and/or kV according to patient size and/or use of iterative reconstruction technique. COMPARISON:  None. FINDINGS: CT HEAD FINDINGS Brain: Generalized cerebral atrophy. Patchy and ill-defined hypoattenuation within the cerebral white matter, nonspecific but compatible with mild-to-moderate chronic small vessel ischemic disease. There is no acute intracranial hemorrhage. No demarcated cortical infarct. No extra-axial fluid collection. No evidence of an intracranial mass. No midline shift. Vascular: No hyperdense vessel.  Atherosclerotic calcifications. Skull: No calvarial fracture or aggressive osseous lesion. Sinuses/Orbits: No mass or acute finding within the imaged orbits. Postsurgical appearance of the paranasal sinuses. Mild mucosal thickening within the bilateral maxillary sinuses at the imaged levels (right greater than left). Moderate anterior right ethmoid sinusitis. Mild mucosal thickening within the right frontal sinus. Other: Small left mastoid effusion. CT CERVICAL SPINE FINDINGS Alignment: Dextrocurvature of the cervical spine. Slight T1-T2 grade 1 anterolisthesis. Skull base and vertebrae: The basion-dental  and atlanto-dental intervals are maintained.No evidence of acute fracture to the cervical spine. Soft tissues and spinal canal: No prevertebral soft tissue swelling or visible canal hematoma. Disc levels: Cervical spondylosis with multilevel bulges/central disc protrusions, posterior disc osteophyte complexes, uncovertebral hypertrophy and facet arthropathy. Disc space narrowing is greatest at C4-C5 and C5-C6 (advanced at these levels). No appreciable high-grade spinal canal stenosis. Multilevel bony neural foraminal narrowing. Upper chest: No consolidation within the imaged lung apices. No visible pneumothorax. IMPRESSION: CT head: 1.  No evidence of an acute intracranial abnormality. 2. Parenchymal atrophy and chronic small vessel ischemic disease. 3. Paranasal sinus disease at the imaged levels, as described. 4. Small left mastoid effusion. CT cervical spine: 1. No evidence of an acute cervical spine fracture. 2. Mild T1-T2 grade 1 anterolisthesis. 3. Dextrocurvature of the cervical spine. 4. Cervical spondylosis as described. Electronically Signed   By: Jackey Loge D.O.   On: 07/18/2023 10:30   CT Cervical Spine Wo Contrast Result Date: 07/18/2023 CLINICAL DATA:  Provided history: Neck trauma. Head trauma, minor. Additional history provided: two falls in the last two days. EXAM: CT HEAD WITHOUT CONTRAST CT CERVICAL SPINE WITHOUT CONTRAST TECHNIQUE: Multidetector CT imaging of the head and cervical spine was performed following the standard protocol without intravenous contrast. Multiplanar CT image reconstructions of the cervical spine were also generated. RADIATION DOSE REDUCTION: This exam was performed according to the departmental dose-optimization program which includes automated exposure control, adjustment of the mA and/or kV according to patient size and/or use of iterative reconstruction technique. COMPARISON:  None. FINDINGS: CT HEAD FINDINGS Brain: Generalized cerebral atrophy. Patchy and  ill-defined hypoattenuation within the cerebral white matter, nonspecific but compatible with mild-to-moderate chronic small vessel ischemic disease. There is no acute intracranial hemorrhage. No demarcated cortical infarct. No extra-axial fluid collection. No evidence of an intracranial mass. No midline shift. Vascular: No hyperdense vessel.  Atherosclerotic calcifications. Skull: No calvarial fracture or aggressive osseous lesion. Sinuses/Orbits: No mass or acute finding within the imaged orbits. Postsurgical appearance of the  paranasal sinuses. Mild mucosal thickening within the bilateral maxillary sinuses at the imaged levels (right greater than left). Moderate anterior right ethmoid sinusitis. Mild mucosal thickening within the right frontal sinus. Other: Small left mastoid effusion. CT CERVICAL SPINE FINDINGS Alignment: Dextrocurvature of the cervical spine. Slight T1-T2 grade 1 anterolisthesis. Skull base and vertebrae: The basion-dental and atlanto-dental intervals are maintained.No evidence of acute fracture to the cervical spine. Soft tissues and spinal canal: No prevertebral soft tissue swelling or visible canal hematoma. Disc levels: Cervical spondylosis with multilevel bulges/central disc protrusions, posterior disc osteophyte complexes, uncovertebral hypertrophy and facet arthropathy. Disc space narrowing is greatest at C4-C5 and C5-C6 (advanced at these levels). No appreciable high-grade spinal canal stenosis. Multilevel bony neural foraminal narrowing. Upper chest: No consolidation within the imaged lung apices. No visible pneumothorax. IMPRESSION: CT head: 1.  No evidence of an acute intracranial abnormality. 2. Parenchymal atrophy and chronic small vessel ischemic disease. 3. Paranasal sinus disease at the imaged levels, as described. 4. Small left mastoid effusion. CT cervical spine: 1. No evidence of an acute cervical spine fracture. 2. Mild T1-T2 grade 1 anterolisthesis. 3. Dextrocurvature of  the cervical spine. 4. Cervical spondylosis as described. Electronically Signed   By: Jackey Loge D.O.   On: 07/18/2023 10:30       Time spent: 50 min     Sunnie Nielsen, DO [facility] 07/19/2023, 4:54 PM    Dictation software may have been used to generate the above note. Typos may occur and escape review in typed/dictated notes. Please contact Dr Lyn Hollingshead directly for clarity if needed.  Staff may message me via secure chat in Epic  but this may not receive an immediate response,  please page me for urgent matters!  If 7PM-7AM, please contact night coverage [email or web address]

## 2023-07-19 NOTE — Plan of Care (Signed)

## 2023-07-19 NOTE — Evaluation (Signed)
 Occupational Therapy Evaluation Patient Details Name: Brian Huffman. MRN: [id number] DOB: [birth year] Today's Date: 07/19/2023   History of Present Illness   Pt is a 78 year old male with history of prediabetes, hyperlipidemia, hypertension, hypothyroid, neuropathy, insomnia, CKD stage IV, adenomatous polyp of the colon, trigeminal neuralgia, who presents to the emergency apartment for chief concerns of falling 2 times over the last 2 days, workup for DKA.     Clinical Impressions Patient presenting with decreased Ind in self care,balance, functional mobility/transfers, endurance, and safety awareness. Patient reports living at home with wife and being Ind at baseline. He is able to demonstrate techniques such as figure four position for LB dressing this session and transfers with CGA. Pt endorses sitting up in recliner chair since this morning. Patient will benefit from acute OT to increase overall independence in the areas of ADLs, functional mobility, and safety awareness in order to safely discharge.     If plan is discharge home, recommend the following:   A little help with walking and/or transfers;A little help with bathing/dressing/bathroom;Assist for transportation;Assistance with cooking/housework;Help with stairs or ramp for entrance     Functional Status Assessment   Patient has had a recent decline in their functional status and demonstrates the ability to make significant improvements in function in a reasonable and predictable amount of time.     Equipment Recommendations   Other (comment) (RW)      Precautions/Restrictions   Precautions Precautions: Fall Recall of Precautions/Restrictions: Intact     Mobility Bed Mobility               General bed mobility comments: pt up in recliner at start/end of session    Transfers Overall transfer level: Needs assistance Equipment used: Rolling walker (2 wheels) Transfers: Sit to/from Stand Sit to  Stand: Min assist, Contact guard assist           General transfer comment: minA to stand due to poor hand placement, steadying assist. CGA to return to sitting with safe hand placement cues.      Balance Overall balance assessment: Needs assistance Sitting-balance support: Feet supported Sitting balance-Leahy Scale: Good     Standing balance support: Reliant on assistive device for balance, Bilateral upper extremity supported Standing balance-Leahy Scale: Fair                             ADL either performed or assessed with clinical judgement   ADL Overall ADL's : Needs assistance/impaired             Lower Body Bathing: Supervison/ safety;Set up Lower Body Bathing Details (indicate cue type and reason): figure four position to thread clothing onto B LEs         Toilet Transfer: Contact guard assist Toilet Transfer Details (indicate cue type and reason): simulated                 Vision Patient Visual Report: No change from baseline              Pertinent Vitals/Pain Pain Assessment Pain Assessment: 0-10 Pain Score: 5  Pain Location: R foot Pain Descriptors / Indicators: Aching, Sore Pain Intervention(s): Limited activity within patient's tolerance, Monitored during session, Repositioned     Extremity/Trunk Assessment Upper Extremity Assessment Upper Extremity Assessment: Generalized weakness   Lower Extremity Assessment Lower Extremity Assessment: Generalized weakness       Communication Communication Communication: No apparent difficulties   Cognition Arousal:  Alert Behavior During Therapy: [facility] for tasks assessed/performed Cognition: No apparent impairments                               Following commands: Intact       Cueing  General Comments   Cueing Techniques: Verbal cues              Home Living Family/patient expects to be discharged to:: Private residence Living Arrangements:  Spouse/significant other Available Help at Discharge: Family;Available PRN/intermittently Type of Home: House Home Access: Stairs to enter [employer] of Steps: 3 Entrance Stairs-Rails: Can reach both Home Layout: One level     Bathroom Shower/Tub: Chief Strategy Officer: Standard     Home Equipment: None   Additional Comments: 3 falls prior to admission, no other falls in the last 6 months      Prior Functioning/Environment Prior Level of Function : Independent/Modified Independent;Driving                    OT Problem List: Decreased strength;Decreased activity tolerance;Decreased safety awareness;Impaired balance (sitting and/or standing);Decreased knowledge of use of DME or AE   OT Treatment/Interventions: Self-care/ADL training;Therapeutic exercise;Therapeutic activities;Energy conservation;DME and/or AE instruction;Patient/family education;Balance training      OT Goals(Current goals can be found in the care plan section)   Acute Rehab OT Goals Patient Stated Goal: to go home OT Goal Formulation: With patient Time For Goal Achievement: 08/02/23 Potential to Achieve Goals: Fair ADL Goals Pt Will Perform Grooming: (P) with modified independence;standing Pt Will Transfer to Toilet: (P) with modified independence;ambulating Pt Will Perform Toileting - Clothing Manipulation and hygiene: (P) with modified independence;sit to/from stand Pt Will Perform Tub/Shower Transfer: (P) with modified independence;ambulating   OT Frequency:  Min 2X/week       AM-PAC OT "6 Clicks" Daily Activity     Outcome Measure Help from another person eating meals?: None Help from another person taking care of personal grooming?: None Help from another person toileting, which includes using toliet, bedpan, or urinal?: A Little Help from another person bathing (including washing, rinsing, drying)?: A Little Help from another person to put on and taking off  regular upper body clothing?: None Help from another person to put on and taking off regular lower body clothing?: A Little 6 Click Score: 21   End of Session Nurse Communication: Mobility status  Activity Tolerance: Patient tolerated treatment well Patient left: in bed;with call bell/phone within reach;with bed alarm set  OT Visit Diagnosis: Unsteadiness on feet (R26.81);Repeated falls (R29.6);Muscle weakness (generalized) (M62.81)                Time: 6045-4098 OT Time Calculation (min): 14 min Charges:  OT General Charges $OT Visit: 1 Visit OT Evaluation $OT Eval Low Complexity: 1 Low  Jackquline Denmark, MS, OTR/L , CBIS ascom [phone]  07/19/23, 4:26 PM

## 2023-07-19 NOTE — Evaluation (Signed)
 Physical Therapy Evaluation Patient Details Name: Brian Huffman. MRN: [id number] DOB: Jan 07, 1946 Today's Date: 07/19/2023  History of Present Illness  Pt is a 78 year old male with history of prediabetes, hyperlipidemia, hypertension, hypothyroid, neuropathy, insomnia, CKD stage IV, adenomatous polyp of the colon, trigeminal neuralgia, who presents to the emergency apartment for chief concerns of falling 2 times over the last 2 days, workup for DKA.  Clinical Impression  The patient was up in recliner at start/end of session, reported 4-5/10 R foot pain, most pain with weight bearing. Per pt at baseline he is independent, lives with his wife, no other falls than falls prior to this admission. He was able to sit <> Stand with RW and minA for steadying. Pt pulled on RW despite education to utilize chair arms, but when returning to sitting demonstrated good carryover and CGA. He ambulated ~114ft with chair follow, CGA and RW. Noted for improved gait mechanics and velocity over time, pt reported feeling a bit more confident in his ability after ambulating as well. Pt returned to room with needs in reach.  Overall the patient demonstrated deficits (see "PT Problem List") that impede the patient's functional abilities, safety, and mobility and would benefit from skilled PT intervention.          If plan is discharge home, recommend the following: A little help with bathing/dressing/bathroom;Assistance with cooking/housework;Assist for transportation;Help with stairs or ramp for entrance   Can travel by private vehicle        Equipment Recommendations Rolling walker (2 wheels);BSC/3in1  Recommendations for Other Services       Functional Status Assessment Patient has had a recent decline in their functional status and demonstrates the ability to make significant improvements in function in a reasonable and predictable amount of time.     Precautions / Restrictions Precautions Precautions:  Fall Recall of Precautions/Restrictions: Intact Restrictions Weight Bearing Restrictions Per Provider Order: No      Mobility  Bed Mobility               General bed mobility comments: pt up in recliner at start/end of session    Transfers Overall transfer level: Needs assistance Equipment used: Rolling walker (2 wheels) Transfers: Sit to/from Stand Sit to Stand: Min assist, Contact guard assist           General transfer comment: minA to stand due to poor hand placement, steadying assist. CGA to return to sitting with safe hand placement cues    Ambulation/Gait Ambulation/Gait assistance: Contact guard assist Gait Distance (Feet): 180 Feet Assistive device: Rolling walker (2 wheels)         General Gait Details: clsoe chair follow, improved gait mechanics over time pt reported like his stiffness was improving  Stairs            Wheelchair Mobility     Tilt Bed    Modified Rankin (Stroke Patients Only)       Balance Overall balance assessment: Needs assistance Sitting-balance support: Feet supported Sitting balance-Leahy Scale: Good     Standing balance support: Reliant on assistive device for balance, Bilateral upper extremity supported Standing balance-Leahy Scale: Fair                               Pertinent Vitals/Pain Pain Assessment Pain Assessment: 0-10 Pain Score: 5  Pain Location: R foot with ambulation Pain Descriptors / Indicators: Aching, Sore Pain Intervention(s): Limited activity within patient's tolerance, Monitored  during session, Repositioned    Home Living Family/patient expects to be discharged to:: Private residence Living Arrangements: Spouse/significant other   Type of Home: House Home Access: Stairs to enter Entrance Stairs-Rails: Can reach both Entrance Stairs-Number of Steps: 3   Home Layout: One level Home Equipment: None Additional Comments: 3 falls prior to admission, no other falls in the  last 6 months    Prior Function Prior Level of Function : Independent/Modified Independent;Driving                     Extremity/Trunk Assessment   Upper Extremity Assessment Upper Extremity Assessment: Generalized weakness    Lower Extremity Assessment Lower Extremity Assessment: Generalized weakness       Communication        Cognition Arousal: Alert Behavior During Therapy: WFL for tasks assessed/performed   PT - Cognitive impairments: No apparent impairments                                 Cueing       General Comments      Exercises     Assessment/Plan    PT Assessment Patient needs continued PT services  PT Problem List Decreased strength;Decreased range of motion;Decreased balance;Decreased mobility;Decreased coordination       PT Treatment Interventions Neuromuscular re-education;DME instruction;Gait training;Stair training;Patient/family education;Functional mobility training;Therapeutic activities;Therapeutic exercise;Balance training    PT Goals (Current goals can be found in the Care Plan section)  Acute Rehab PT Goals Patient Stated Goal: to go home PT Goal Formulation: With patient Time For Goal Achievement: 08/02/23 Potential to Achieve Goals: Good    Frequency Min 2X/week     Co-evaluation               AM-PAC PT "6 Clicks" Mobility  Outcome Measure Help needed turning from your back to your side while in a flat bed without using bedrails?: A Little Help needed moving from lying on your back to sitting on the side of a flat bed without using bedrails?: A Little Help needed moving to and from a bed to a chair (including a wheelchair)?: A Little Help needed standing up from a chair using your arms (e.g., wheelchair or bedside chair)?: A Little Help needed to walk in hospital room?: A Little Help needed climbing 3-5 steps with a railing? : A Lot 6 Click Score: 17    End of Session Equipment Utilized During  Treatment: Gait belt Activity Tolerance: Patient tolerated treatment well Patient left: in chair;with call bell/phone within reach Nurse Communication: Mobility status PT Visit Diagnosis: Other abnormalities of gait and mobility (R26.89);Difficulty in walking, not elsewhere classified (R26.2);Muscle weakness (generalized) (M62.81);Pain Pain - Right/Left: Right Pain - part of body: Ankle and joints of foot    Time: 1610-9604 PT Time Calculation (min) (ACUTE ONLY): 17 min   Charges:   PT Evaluation $PT Eval Low Complexity: 1 Low PT Treatments $Therapeutic Activity: 8-22 mins PT General Charges $$ ACUTE PT VISIT: 1 Visit       Olga Coaster PT, DPT 3:59 PM,07/19/23

## 2023-07-19 NOTE — Plan of Care (Signed)
  Problem: Education: Goal: Ability to describe self-care measures that may prevent or decrease complications (Diabetes Survival Skills Education) will improve Outcome: Progressing   Problem: Coping: Goal: Ability to adjust to condition or change in health will improve Outcome: Progressing   Problem: Fluid Volume: Goal: Ability to maintain a balanced intake and output will improve Outcome: Progressing   Problem: Health Behavior/Discharge Planning: Goal: Ability to identify and utilize available resources and services will improve Outcome: Progressing Goal: Ability to manage health-related needs will improve Outcome: Progressing   Problem: Metabolic: Goal: Ability to maintain appropriate glucose levels will improve Outcome: Progressing   Problem: Nutritional: Goal: Maintenance of adequate nutrition will improve Outcome: Progressing   Problem: Skin Integrity: Goal: Risk for impaired skin integrity will decrease Outcome: Progressing   Problem: Tissue Perfusion: Goal: Adequacy of tissue perfusion will improve Outcome: Progressing   Problem: Cardiac: Goal: Ability to maintain an adequate cardiac output will improve Outcome: Progressing   Problem: Metabolic: Goal: Ability to maintain appropriate glucose levels will improve Outcome: Progressing   Problem: Respiratory: Goal: Will regain and/or maintain adequate ventilation Outcome: Progressing   Problem: Clinical Measurements: Goal: Will remain free from infection Outcome: Progressing Goal: Diagnostic test results will improve Outcome: Progressing Goal: Respiratory complications will improve Outcome: Progressing Goal: Cardiovascular complication will be avoided Outcome: Progressing   Problem: Activity: Goal: Risk for activity intolerance will decrease Outcome: Progressing

## 2023-07-20 ENCOUNTER — Other Ambulatory Visit: Payer: Self-pay

## 2023-07-20 ENCOUNTER — Other Ambulatory Visit (HOSPITAL_COMMUNITY): Payer: Self-pay

## 2023-07-20 ENCOUNTER — Inpatient Hospital Stay

## 2023-07-20 DIAGNOSIS — E1110 Type 2 diabetes mellitus with ketoacidosis without coma: Secondary | ICD-10-CM | POA: Diagnosis not present

## 2023-07-20 LAB — PHOSPHORUS: Phosphorus: 2.8 mg/dL (ref 2.5–4.6)

## 2023-07-20 LAB — BASIC METABOLIC PANEL WITH GFR
Anion gap: 10 (ref 5–15)
BUN: 13 mg/dL (ref 8–23)
CO2: 23 mmol/L (ref 22–32)
Calcium: 8.9 mg/dL (ref 8.9–10.3)
Chloride: 104 mmol/L (ref 98–111)
Creatinine, Ser: 0.81 mg/dL (ref 0.61–1.24)
GFR, Estimated: >60 mL/min (ref >60)
Glucose, Bld: 235 mg/dL — ABNORMAL HIGH (ref 70–99)
Potassium: 4 mmol/L (ref 3.5–5.1)
Sodium: 137 mmol/L (ref 135–145)

## 2023-07-20 LAB — GLUCOSE, CAPILLARY
Glucose-Capillary: 363 mg/dL — ABNORMAL HIGH (ref 70–99)
Glucose-Capillary: 226 mg/dL — ABNORMAL HIGH (ref 70–99)
Glucose-Capillary: 377 mg/dL — ABNORMAL HIGH (ref 70–99)
Glucose-Capillary: 317 mg/dL — ABNORMAL HIGH (ref 70–99)
Glucose-Capillary: 254 mg/dL — ABNORMAL HIGH (ref 70–99)

## 2023-07-20 LAB — HEMOGLOBIN A1C
Hgb A1c MFr Bld: 15 % — ABNORMAL HIGH (ref 4.8–5.6)
Mean Plasma Glucose: 384 mg/dL

## 2023-07-20 LAB — MAGNESIUM: Magnesium: 2 mg/dL (ref 1.7–2.4)

## 2023-07-20 MED ORDER — ONETOUCH DELICA PLUS LANCET33G MISC
1.0000 | Freq: Three times a day (TID) | 0 refills | Status: AC
  Filled 2023-07-20: qty 100, 30d supply, fill #0

## 2023-07-20 MED ORDER — LANCET DEVICE MISC
1.0000 | Freq: Three times a day (TID) | 0 refills | Status: AC
  Filled 2023-07-20: qty 1, 30d supply, fill #0

## 2023-07-20 MED ORDER — GLIPIZIDE ER 5 MG PO TB24
5.0000 mg | ORAL_TABLET | Freq: Every day | ORAL | 0 refills | Status: AC
  Filled 2023-07-20: qty 30, 30d supply, fill #0

## 2023-07-20 MED ORDER — INSULIN PEN NEEDLE 32G X 4 MM MISC
0 refills | Status: AC
  Filled 2023-07-20: qty 100, 30d supply, fill #0

## 2023-07-20 MED ORDER — CLOTRIMAZOLE 1 % EX CREA
1.0000 | TOPICAL_CREAM | Freq: Two times a day (BID) | CUTANEOUS | 0 refills | Status: AC
  Filled 2023-07-20: qty 28, 14d supply, fill #0

## 2023-07-20 MED ORDER — BLOOD GLUCOSE TEST VI STRP
1.0000 | ORAL_STRIP | Freq: Three times a day (TID) | 0 refills | Status: AC
  Filled 2023-07-20: qty 100, 34d supply, fill #0

## 2023-07-20 MED ORDER — FUROSEMIDE 40 MG PO TABS: 40.0000 mg | ORAL_TABLET | Freq: Every day | ORAL | Status: AC | PRN

## 2023-07-20 MED ORDER — GLIPIZIDE ER 5 MG PO TB24
5.0000 mg | ORAL_TABLET | Freq: Every day | ORAL | Status: DC
  Administered 2023-07-20: 5 mg via ORAL
  Filled 2023-07-20: qty 1

## 2023-07-20 MED ORDER — BLOOD GLUCOSE MONITOR SYSTEM W/DEVICE KIT
1.0000 | PACK | Freq: Three times a day (TID) | 0 refills | Status: AC
  Filled 2023-07-20: qty 1, 30d supply, fill #0

## 2023-07-20 MED ORDER — LANTUS SOLOSTAR 100 UNIT/ML ~~LOC~~ SOPN
25.0000 [IU] | PEN_INJECTOR | Freq: Every day | SUBCUTANEOUS | 0 refills | Status: AC
  Filled 2023-07-20: qty 15, 60d supply, fill #0

## 2023-07-20 NOTE — Discharge Summary (Signed)
 Physician Discharge Summary   Patient: Brian Huffman. MRN: [id number]  DOB: [birth year]   Admit:     Date of Admission: 07/18/2023 Admitted from: home   Discharge: Date of discharge: 07/20/23 Disposition: Home health Condition at discharge: good  CODE STATUS: FULL CODE     Discharge Physician: Sunnie Nielsen, DO [facility]     PCP: Garry Heater, MD  Recommendations for Outpatient Follow-up:  Follow up with PCP Pernell Dupre, Laurette Schimke, MD in 1-2 weeks Referral to podiatry    Discharge Instructions     Ambulatory referral to Podiatry   Complete by: As directed    Foot pain, diabetic patient,   Diet Carb Modified   Complete by: As directed    Increase activity slowly   Complete by: As directed          Discharge Diagnoses: Principal Problem:   DKA (diabetic ketoacidosis) (HCC) Active Problems:   Reactive airway disease   Essential hypertension   Elevated serum creatinine   Hypophosphatemia   Morbid obesity (HCC)   Hypothyroidism   BPH (benign prostatic hyperplasia)   Metabolic syndrome   High anion gap metabolic acidosis   Hypotension      Hospital course / significant events:   HPI:  Brian Huffman, [name] is a 78 year old male with history of prediabetes, hyperlipidemia, hypertension, hypothyroid, neuropathy, insomnia, CKD stage IV, adenomatous polyp of the colon, trigeminal neuralgia, who presents to the emergency apartment for chief concerns of falling 2 times over the last 2 days. Per ED documentation, EMS brought patient to the ED for hyperglycemia with a CBG of 546.   04/01: admitted to hospitalist service on insulin gtt. Low BP requiring levophed gtt, mild elevation in troponin but no chest pain. EKG unchanged.  04/02: troponin flat and no symptoms, EKG stable. Off insulin gtt, transition to sq. Complaining of R foot pain, c/w neuropathy. Imaging neg.  04/03: reports L foot pain now and bruising. See photos - no  apparent gout or vascular problem, no apparent infection other than tinea pedis interdigitally. Ased Dr Larina Earthly (podiatry) to review XR and photos, ok for follow up in office. DIabetes educator reviewed meds/plan w/ patient, pt advised needs hospital follow up w/ PCP ASAP      Consultants:  none  Procedures/Surgeries: none      ASSESSMENT & PLAN:   DKA (diabetic ketoacidosis) - resolved High anion gap metabolic acidosis DM2 w/ hyperglycemia  D/c on glipizide + long acting insulin Close PCP follow up    Reactive airway disease Does not appear to be in acute exacerbation at this time Home meds   Essential hypertension Hypotension - resolved  Cont home irbesartan    BPH (benign prostatic hyperplasia) Flomax 0.4 mg daily with supper    Hypothyroidism Levothyroxine 200 mcg daily    Hypophosphatemia Replete as needed Monitor levels   Elevated serum creatinine, improved  Check BMP outpatient    Foot pain on R suspect neuropathy +/- plantar fasciitis  Foot pain on L suspect trauma related Tinea pedis  Ambulatory dysfunction PT/OT Gabapentin Pain control  Clotrimazole topical tx Podiatry follow up    Metabolic syndrome, with truncal obesity Class 2 obesity based on BMI: Body mass index is 39.22 kg/m.  Underweight - under 18  overweight - 25 to 29 obese - 30 or more Class 1 obesity: BMI of 30.0 to 34 Class 2 obesity: BMI of 35.0 to 39 Class 3 obesity: BMI of 40.0 to 49 Super  Morbid Obesity: BMI 50-59 Super-super Morbid Obesity: BMI 60+ Significantly low or high BMI is associated with higher medical risk.  Weight management advised as adjunct to other disease management and risk reduction treatments           Discharge Instructions  Allergies as of 07/20/2023       Reactions   [place] Grass Extract Other (See Comments)   Difficulty breathing   Azithromycin Hives   Other Other (See Comments)   DUST, sneezing, watery eyes   Statins Other (See  Comments)   Atorvastatin, simvastatin and pravastatin     Penicillins Rash   Shellfish-derived Products Rash        Medication List     STOP taking these medications    azelastine 0.1 % nasal spray Commonly known as: ASTELIN       TAKE these medications    AeroChamber MV inhaler Use as instructed   albuterol 108 (90 Base) MCG/ACT inhaler Commonly known as: VENTOLIN HFA Inhale 2 puffs into the lungs every 6 (six) hours as needed for wheezing or shortness of breath.   allopurinol 100 MG tablet Commonly known as: ZYLOPRIM Take 1 tablet by mouth daily.   Blood Glucose Monitor System w/Device Kit 1 each by Does not apply route in the morning, at noon, and at bedtime. May substitute to any manufacturer covered by patient's insurance.   BLOOD GLUCOSE TEST STRIPS Strp 1 each by In Vitro route in the morning, at noon, and at bedtime. May substitute to any manufacturer covered by patient's insurance.   carbamazepine 100 MG 12 hr tablet Commonly known as: TEGRETOL XR Take 100 mg by mouth 4 (four) times daily.   cetirizine 10 MG tablet Commonly known as: ZYRTEC Take 10 mg by mouth daily as needed for allergies or rhinitis.   clotrimazole 1 % cream Commonly known as: Clotrimazole Anti-Fungal Apply 1 Application topically 2 (two) times daily. Apply to area of redness between toes   Coenzyme Q10 100 MG Tabs Take 1 tablet by mouth daily.   fluticasone 50 MCG/ACT nasal spray Commonly known as: FLONASE Place 2 sprays into both nostrils daily as needed for rhinitis or allergies.   furosemide 40 MG tablet Commonly known as: LASIX Take 1 tablet (40 mg total) by mouth daily as needed for edema. What changed:  when to take this reasons to take this   gabapentin 100 MG capsule Commonly known as: NEURONTIN Take 100 mg by mouth 3 (three) times daily.   glipiZIDE 5 MG 24 hr tablet Commonly known as: GLUCOTROL XL Take 1 tablet (5 mg total) by mouth daily with  breakfast. Start taking on: July 21, 2023   Insulin Pen Needle 32G X 4 MM Misc Use as directed   Lancet Device Misc 1 each by Does not apply route in the morning, at noon, and at bedtime. May substitute to any manufacturer covered by patient's insurance.   Lantus SoloStar 100 UNIT/ML Solostar Pen Generic drug: insulin glargine Inject 25 Units into the skin at bedtime.   levothyroxine 200 MCG tablet Commonly known as: SYNTHROID Take 200 mcg by mouth every evening.   lidocaine 5 % Commonly known as: LIDODERM Place 1 patch onto the skin daily.   milk thistle 175 MG tablet Take 175 mg by mouth daily.   multivitamin tablet Take 1 tablet by mouth daily.   OneTouch Delica Plus Lancet33G Misc 1 each in the morning, at noon, and at bedtime. May substitute to any manufacturer covered by patient's  insurance.   pantoprazole 40 MG tablet Commonly known as: PROTONIX Take 40 mg by mouth daily.   rosuvastatin 20 MG tablet Commonly known as: CRESTOR Take 10 mg by mouth daily.   tamsulosin 0.4 MG Caps capsule Commonly known as: FLOMAX Take 0.4 mg by mouth daily.   tiotropium 18 MCG inhalation capsule Commonly known as: SPIRIVA Place 1 capsule (18 mcg total) into inhaler and inhale daily. What changed: when to take this   valsartan 320 MG tablet Commonly known as: DIOVAN Take 320 mg by mouth daily.   VITAMIN B COMPLEX PO Take 1 tablet by mouth daily.   Vitamin D3 250 MCG (10000 UT) Tabs Take 1 tablet by mouth daily.               Durable Medical Equipment  (From admission, onward)           Start     Ordered   07/20/23 0841  For home use only DME Walker rolling  Once       Question Answer Comment  Walker: With 5 Inch Wheels   Patient needs a walker to treat with the following condition Ambulatory dysfunction      07/20/23 0841   07/20/23 0841  For home use only DME 3 n 1  Once        07/20/23 0454             Follow-up Information     Gwyneth Revels, DPM. Schedule an appointment as soon as possible for a visit today.   Specialty: Podiatry Why: 1-2 weeks foot pain hospital follow up Contact information: [address] [place] [address] [phone]         Garry Heater, MD. Schedule an appointment as soon as possible for a visit.   Specialty: Family Medicine Why: hospital follow up - DKA / worsening diabetes Contact information: [address] [place] [address] [phone]                 Allergies  Allergen Reactions   [place] Grass Extract Other (See Comments)    Difficulty breathing   Azithromycin Hives   Other Other (See Comments)    DUST, sneezing, watery eyes   Statins Other (See Comments)    Atorvastatin, simvastatin and pravastatin     Penicillins Rash   Shellfish-Derived Products Rash     Subjective: pt denies CP/SOB, ambulating, foot pain is bothersome but not debilitating, concerned about bruising on L foot   Discharge Exam: BP 139/70 (BP Location: Left Arm)   Pulse 72   Temp 98.6 F (37 C) (Oral)   Resp 19   Ht 5\' 5"  (1.651 m)   Wt 112.1 kg   SpO2 96%   BMI 41.13 kg/m  General: Pt is alert, awake, not in acute distress Cardiovascular: RRR, S1/S2 +, no rubs, no gallops Respiratory: CTA bilaterally, no wheezing, no rhonchi Abdominal: Soft, NT, ND, bowel sounds + Extremities: no edema, no cyanosis. R foot - nontender compared to yesterdayday's tenderness plantar aspect, L foot (+)bruising see photos, normal ROM and nontender           The results of significant diagnostics from this hospitalization (including imaging, microbiology, ancillary and laboratory) are listed below for reference.     Microbiology: Recent Results (from the past 240 hours)  MRSA Next Gen by PCR, Nasal     Status: None   Collection Time: 07/18/23  3:34 PM   Specimen: Nasal  Mucosa; Nasal Swab  Result Value Ref Range Status   MRSA by PCR Next Gen NOT DETECTED NOT  DETECTED Final    Comment: (NOTE) The GeneXpert MRSA Assay (FDA approved for NASAL specimens only), is one component of a comprehensive MRSA colonization surveillance program. It is not intended to diagnose MRSA infection nor to guide or monitor treatment for MRSA infections. Test performance is not FDA approved in patients less than 79 years old. Performed at [facility], [address] [place], [place] [address]      Labs: BNP (last 3 results) No results for input(s): "BNP" in the last 8760 hours. Basic Metabolic Panel: Recent Labs  Lab 07/18/23 0907 07/18/23 1116 07/18/23 1902 07/18/23 2251 07/19/23 0242 07/20/23 0308  NA 131*  --  136 136 135 137  K 5.0  --  3.7 3.8 3.8 4.0  CL 91*  --  101 103 102 104  CO2 18*  --  23 23 25 23   GLUCOSE 509*  --  207* 200* 176* 235*  BUN 21  --  18 16 14 13   CREATININE 1.38*  --  1.01 0.82 0.76 0.81  CALCIUM 9.4  --  9.0 8.4* 8.2* 8.9  MG  --  1.8  --   --  1.8 2.0  PHOS  --  1.1*  --   --  3.7 2.8   Liver Function Tests: Recent Labs  Lab 07/18/23 0907  AST 48*  ALT 27  ALKPHOS 77  BILITOT 1.7*  PROT 7.0  ALBUMIN 4.0   No results for input(s): "LIPASE", "AMYLASE" in the last 168 hours. No results for input(s): "AMMONIA" in the last 168 hours. CBC: Recent Labs  Lab 07/18/23 0907  WBC 5.9  NEUTROABS 3.3  HGB 15.6  HCT 45.3  MCV 96.0  PLT 97*   Cardiac Enzymes: Recent Labs  Lab 07/18/23 1627  CKTOTAL 124   BNP: Invalid input(s): "POCBNP" CBG: Recent Labs  Lab 07/19/23 1639 07/19/23 1723 07/19/23 2109 07/20/23 0733 07/20/23 1111  GLUCAP 331* 363* 298* 226* 377*   D-Dimer No results for input(s): "DDIMER" in the last 72 hours. Hgb A1c Recent Labs    07/19/23 0242  HGBA1C 15.0*   Lipid Profile No results for input(s): "CHOL", "HDL", "LDLCALC", "TRIG", "CHOLHDL", "LDLDIRECT" in the last 72 hours. Thyroid function studies No results for input(s): "TSH", "T4TOTAL", "T3FREE", "THYROIDAB" in  the last 72 hours.  Invalid input(s): "FREET3" Anemia work up No results for input(s): "VITAMINB12", "FOLATE", "FERRITIN", "TIBC", "IRON", "RETICCTPCT" in the last 72 hours. Urinalysis    Component Value Date/Time   COLORURINE YELLOW (A) 07/18/2023 1546   APPEARANCEUR CLEAR (A) 07/18/2023 1546   LABSPEC 1.025 07/18/2023 1546   PHURINE 6.0 07/18/2023 1546   GLUCOSEU >=500 (A) 07/18/2023 1546   HGBUR MODERATE (A) 07/18/2023 1546   BILIRUBINUR NEGATIVE 07/18/2023 1546   KETONESUR 80 (A) 07/18/2023 1546   PROTEINUR NEGATIVE 07/18/2023 1546   NITRITE NEGATIVE 07/18/2023 1546   LEUKOCYTESUR NEGATIVE 07/18/2023 1546   Sepsis Labs Recent Labs  Lab 07/18/23 0907  WBC 5.9   Microbiology Recent Results (from the past 240 hours)  MRSA Next Gen by PCR, Nasal     Status: None   Collection Time: 07/18/23  3:34 PM   Specimen: Nasal Mucosa; Nasal Swab  Result Value Ref Range Status   MRSA by PCR Next Gen NOT DETECTED NOT DETECTED Final    Comment: (NOTE) The GeneXpert MRSA Assay (FDA approved for NASAL specimens only), is  one component of a comprehensive MRSA colonization surveillance program. It is not intended to diagnose MRSA infection nor to guide or monitor treatment for MRSA infections. Test performance is not FDA approved in patients less than 27 years old. Performed at [facility], [address] [place], [place] [address]    Imaging DG Ankle Right Port Result Date: 07/18/2023 CLINICAL DATA:  Right ankle pain EXAM: PORTABLE RIGHT ANKLE - 2 VIEW COMPARISON:  07/18/2023 at 9:30 a.m. FINDINGS: No acute fracture or dislocation. Swelling over the lateral malleolus. IMPRESSION: No acute fracture or dislocation. Swelling over the lateral malleolus. Electronically Signed   By: Minerva Fester M.D.   On: 07/18/2023 20:36   DG Ankle Complete Right Result Date: 07/18/2023 CLINICAL DATA:  Fall. EXAM: RIGHT ANKLE - COMPLETE 3+ VIEW COMPARISON:  None Available. FINDINGS: No acute  fracture or dislocation. No aggressive osseous lesion. Ankle mortise appears intact. Mild degenerative changes of the imaged joints. No focal soft tissue swelling. No radiopaque foreign bodies. IMPRESSION: No acute osseous abnormality of the right ankle. Electronically Signed   By: Jules Schick M.D.   On: 07/18/2023 10:57   DG Chest 2 View Result Date: 07/18/2023 CLINICAL DATA:  Fall. EXAM: CHEST - 2 VIEW COMPARISON:  05/02/2018. FINDINGS: Bilateral lung fields are clear. Bilateral costophrenic angles are clear. Normal cardio-mediastinal silhouette. There is a left sided 2-lead pacemaker. No acute osseous abnormalities. The soft tissues are within normal limits. IMPRESSION: No active cardiopulmonary disease. Electronically Signed   By: Jules Schick M.D.   On: 07/18/2023 10:56   CT Head Wo Contrast Result Date: 07/18/2023 CLINICAL DATA:  Provided history: Neck trauma. Head trauma, minor. Additional history provided: two falls in the last two days. EXAM: CT HEAD WITHOUT CONTRAST CT CERVICAL SPINE WITHOUT CONTRAST TECHNIQUE: Multidetector CT imaging of the head and cervical spine was performed following the standard protocol without intravenous contrast. Multiplanar CT image reconstructions of the cervical spine were also generated. RADIATION DOSE REDUCTION: This exam was performed according to the departmental dose-optimization program which includes automated exposure control, adjustment of the mA and/or kV according to patient size and/or use of iterative reconstruction technique. COMPARISON:  None. FINDINGS: CT HEAD FINDINGS Brain: Generalized cerebral atrophy. Patchy and ill-defined hypoattenuation within the cerebral white matter, nonspecific but compatible with mild-to-moderate chronic small vessel ischemic disease. There is no acute intracranial hemorrhage. No demarcated cortical infarct. No extra-axial fluid collection. No evidence of an intracranial mass. No midline shift. Vascular: No hyperdense  vessel.  Atherosclerotic calcifications. Skull: No calvarial fracture or aggressive osseous lesion. Sinuses/Orbits: No mass or acute finding within the imaged orbits. Postsurgical appearance of the paranasal sinuses. Mild mucosal thickening within the bilateral maxillary sinuses at the imaged levels (right greater than left). Moderate anterior right ethmoid sinusitis. Mild mucosal thickening within the right frontal sinus. Other: Small left mastoid effusion. CT CERVICAL SPINE FINDINGS Alignment: Dextrocurvature of the cervical spine. Slight T1-T2 grade 1 anterolisthesis. Skull base and vertebrae: The basion-dental and atlanto-dental intervals are maintained.No evidence of acute fracture to the cervical spine. Soft tissues and spinal canal: No prevertebral soft tissue swelling or visible canal hematoma. Disc levels: Cervical spondylosis with multilevel bulges/central disc protrusions, posterior disc osteophyte complexes, uncovertebral hypertrophy and facet arthropathy. Disc space narrowing is greatest at C4-C5 and C5-C6 (advanced at these levels). No appreciable high-grade spinal canal stenosis. Multilevel bony neural foraminal narrowing. Upper chest: No consolidation within the imaged lung apices. No visible pneumothorax. IMPRESSION: CT head: 1.  No evidence of an  acute intracranial abnormality. 2. Parenchymal atrophy and chronic small vessel ischemic disease. 3. Paranasal sinus disease at the imaged levels, as described. 4. Small left mastoid effusion. CT cervical spine: 1. No evidence of an acute cervical spine fracture. 2. Mild T1-T2 grade 1 anterolisthesis. 3. Dextrocurvature of the cervical spine. 4. Cervical spondylosis as described. Electronically Signed   By: Jackey Loge D.O.   On: 07/18/2023 10:30   CT Cervical Spine Wo Contrast Result Date: 07/18/2023 CLINICAL DATA:  Provided history: Neck trauma. Head trauma, minor. Additional history provided: two falls in the last two days. EXAM: CT HEAD WITHOUT  CONTRAST CT CERVICAL SPINE WITHOUT CONTRAST TECHNIQUE: Multidetector CT imaging of the head and cervical spine was performed following the standard protocol without intravenous contrast. Multiplanar CT image reconstructions of the cervical spine were also generated. RADIATION DOSE REDUCTION: This exam was performed according to the departmental dose-optimization program which includes automated exposure control, adjustment of the mA and/or kV according to patient size and/or use of iterative reconstruction technique. COMPARISON:  None. FINDINGS: CT HEAD FINDINGS Brain: Generalized cerebral atrophy. Patchy and ill-defined hypoattenuation within the cerebral white matter, nonspecific but compatible with mild-to-moderate chronic small vessel ischemic disease. There is no acute intracranial hemorrhage. No demarcated cortical infarct. No extra-axial fluid collection. No evidence of an intracranial mass. No midline shift. Vascular: No hyperdense vessel.  Atherosclerotic calcifications. Skull: No calvarial fracture or aggressive osseous lesion. Sinuses/Orbits: No mass or acute finding within the imaged orbits. Postsurgical appearance of the paranasal sinuses. Mild mucosal thickening within the bilateral maxillary sinuses at the imaged levels (right greater than left). Moderate anterior right ethmoid sinusitis. Mild mucosal thickening within the right frontal sinus. Other: Small left mastoid effusion. CT CERVICAL SPINE FINDINGS Alignment: Dextrocurvature of the cervical spine. Slight T1-T2 grade 1 anterolisthesis. Skull base and vertebrae: The basion-dental and atlanto-dental intervals are maintained.No evidence of acute fracture to the cervical spine. Soft tissues and spinal canal: No prevertebral soft tissue swelling or visible canal hematoma. Disc levels: Cervical spondylosis with multilevel bulges/central disc protrusions, posterior disc osteophyte complexes, uncovertebral hypertrophy and facet arthropathy. Disc space  narrowing is greatest at C4-C5 and C5-C6 (advanced at these levels). No appreciable high-grade spinal canal stenosis. Multilevel bony neural foraminal narrowing. Upper chest: No consolidation within the imaged lung apices. No visible pneumothorax. IMPRESSION: CT head: 1.  No evidence of an acute intracranial abnormality. 2. Parenchymal atrophy and chronic small vessel ischemic disease. 3. Paranasal sinus disease at the imaged levels, as described. 4. Small left mastoid effusion. CT cervical spine: 1. No evidence of an acute cervical spine fracture. 2. Mild T1-T2 grade 1 anterolisthesis. 3. Dextrocurvature of the cervical spine. 4. Cervical spondylosis as described. Electronically Signed   By: Jackey Loge D.O.   On: 07/18/2023 10:30      Time coordinating discharge: over 30 minutes  SIGNED:  Sunnie Nielsen DO [facility]

## 2023-07-20 NOTE — Progress Notes (Signed)
 Occupational Therapy Treatment Patient Details Name: Brian Huffman. MRN: [id number] DOB: 01-23-1946 Today's Date: 07/20/2023   History of present illness Pt is a 78 year old male with history of prediabetes, hyperlipidemia, hypertension, hypothyroid, neuropathy, insomnia, CKD stage IV, adenomatous polyp of the colon, trigeminal neuralgia, who presents to the emergency apartment for chief concerns of falling 2 times over the last 2 days, workup for DKA.   OT comments  Pt seen for OT tx. Pt received in recliner, no socks/shoes on, and on the phone. Pt agreeable to session. Pt initially endorsing new onset RUE and RLE sensation changes. With time/questioning pt reports RLE sensation changes and pain not new and has been going on for ~76yr and not acutely worse. Eventually able to determine that R hand 4th and 5th digits are experiencing acute sensation changes (tingly). Neuro assessments completed and no additional acute impairments appreciated. MD, RN notified. Pt able to complete seated LB dressing with set up only and completed ADL transfers/mobility with SBA-CGA with RW. Pt progressing towards goals, continues to benefit while hospitalized.       If plan is discharge home, recommend the following:  A little help with walking and/or transfers;A little help with bathing/dressing/bathroom;Assist for transportation;Assistance with cooking/housework;Help with stairs or ramp for entrance   Equipment Recommendations  Other (comment) (RW)    Recommendations for Other Services      Precautions / Restrictions Precautions Precautions: Fall Recall of Precautions/Restrictions: Intact Restrictions Weight Bearing Restrictions Per Provider Order: No       Mobility Bed Mobility               General bed mobility comments: pt up in recliner at start/end of session    Transfers Overall transfer level: Needs assistance Equipment used: Rolling walker (2 wheels) Transfers: Sit to/from  Stand Sit to Stand: Contact guard assist, Supervision           General transfer comment: SBA for STS from recliner     Balance Overall balance assessment: Needs assistance Sitting-balance support: Feet supported Sitting balance-Leahy Scale: Good     Standing balance support: Reliant on assistive device for balance, Bilateral upper extremity supported Standing balance-Leahy Scale: Fair                             ADL either performed or assessed with clinical judgement   ADL Overall ADL's : Needs assistance/impaired                     Lower Body Dressing: Supervision/safety;Set up;Sitting/lateral leans Lower Body Dressing Details (indicate cue type and reason): figure four position to don shoes                    Extremity/Trunk Assessment              Vision       Perception     Praxis     Communication     Cognition Arousal: Alert Behavior During Therapy: [facility] for tasks assessed/performed Cognition: No family/caregiver present to determine baseline             OT - Cognition Comments: Pt requires increased time and cues to verify recent history of pain and sensation changes                 Following commands: Intact        Cueing   Cueing Techniques: Verbal cues  Exercises  Shoulder Instructions       General Comments Pt initially endorsing new onset RUE and RLE sensation changes. With time/questioning pt reports RLE sensation changes and pain not new and has been going on for ~15yr and not acutely worse. Eventually able to determine that R hand 4th and 5th digits are experiencing acute sensation changes (tingly). Neuro assessments completed and no additional acute impairments appreciated. MD, RN notified.    Pertinent Vitals/ Pain       Pain Assessment Pain Assessment: Faces Faces Pain Scale: Hurts a little bit Pain Location: R foot Pain Descriptors / Indicators: Aching, Sore Pain Intervention(s):  Monitored during session, Limited activity within patient's tolerance, Repositioned  Home Living                                          Prior Functioning/Environment              Frequency  Min 2X/week        Progress Toward Goals  OT Goals(current goals can now be found in the care plan section)  Progress towards OT goals: Progressing toward goals  Acute Rehab OT Goals Patient Stated Goal: go home OT Goal Formulation: With patient Time For Goal Achievement: 08/02/23 Potential to Achieve Goals: Good  Plan      Co-evaluation                 AM-PAC OT "6 Clicks" Daily Activity     Outcome Measure   Help from another person eating meals?: None Help from another person taking care of personal grooming?: None Help from another person toileting, which includes using toliet, bedpan, or urinal?: A Little Help from another person bathing (including washing, rinsing, drying)?: A Little Help from another person to put on and taking off regular upper body clothing?: None Help from another person to put on and taking off regular lower body clothing?: None 6 Click Score: 22    End of Session    OT Visit Diagnosis: Unsteadiness on feet (R26.81);Repeated falls (R29.6);Muscle weakness (generalized) (M62.81)   Activity Tolerance Patient tolerated treatment well   Patient Left in chair;with call bell/phone within reach;with chair alarm set   Nurse Communication Other (comment) (R hand sensation deficits - RN, MD)        Time: 1610-9604 OT Time Calculation (min): 20 min  Charges: OT General Charges $OT Visit: 1 Visit OT Treatments $Self Care/Home Management : 8-22 mins  Arman Filter., MPH, MS, OTR/L ascom [phone] 07/20/23, 10:45 AM

## 2023-07-20 NOTE — TOC Benefit Eligibility Note (Signed)
 Pharmacy Patient Advocate Encounter  Insurance verification completed.    The patient is insured through  [facility] . Patient has Medicare and is not eligible for a copay card, but may be able to apply for patient assistance or Medicare RX Payment Plan (Patient Must reach out to their plan, if eligible for payment plan), if available.    Ran test claim for Lantus Solostar and the current 30 day co-pay is $35.  Ran test claim for [employer] and the current 30 day co-pay is $35.   This test claim was processed through [employer]- copay amounts may vary at other pharmacies due to [employer], or as the patient moves through the different stages of their insurance plan.

## 2023-07-20 NOTE — Inpatient Diabetes Management (Addendum)
 Inpatient Diabetes Program Recommendations  AACE/ADA: New Consensus Statement on Inpatient Glycemic Control   Target Ranges:  Prepandial:   less than 140 mg/dL      Peak postprandial:   less than 180 mg/dL (1-2 hours)      Critically ill patients:  140 - 180 mg/dL    Latest Reference Range & Units 07/19/23 07:30 07/19/23 08:43 07/19/23 12:05 07/19/23 16:39 07/19/23 17:23 07/19/23 21:09 07/20/23 07:33  Glucose-Capillary 70 - 99 mg/dL 657 (H) 846 (H) 962 (H) 331 (H) 363 (H) 298 (H) 226 (H)    Review of Glycemic Control  Diabetes history: Prediabetes; new DM dx this admission Outpatient Diabetes medications: NA Current orders for Inpatient glycemic control: Semglee 10 units at bedtime, Novolog 0-9 units TID with meals, Novolog 0-5 units QHS  Inpatient Diabetes Program Recommendations:    Insulin: Patient received Semglee 10 units at 8:03 am on 4/2 and Semglee 10 units at 22:39 on 4/2. CBG 226 mg/dl this morning. Patient will need simplified outpatient DM medication regimen; therefore would recommend using basal and oral DM medication.  Please consider increasing Semglee to 25 units at bedtime and adding Glipizide 2.5 mg daily (to start now).    Outpatient DM: AT time of discharge please provide Rx for glucose monitoring kit (#[id number]), Lantus insulin pens (#[id number]), insulin pen  needles (#[id number]), and oral DM medication.  Addendum 07/20/23@11 :40-Talked with patient, his wife, and sister at bedside. Reviewed DM basics of self management, glucose goals, A1C goal, hyperglycemia, hypoglycemia, and treatment for both.  Reviewed how to check glucose; patient's wife has DM and is aware of how to check glucose. Educated patient,his wife, and sister on insulin pen use at home. Reviewed contents of insulin flexpen starter kit. Reviewed all steps of insulin pen including attachment of needle, 2-unit air shot, dialing up dose, giving injection, removing needle, disposal of sharps, storage of unused insulin,  disposal of insulin etc. Patient able to provide successful return demonstration; patient's wife was also able to demonstrate how to use the insulin pen. Patient asked multiple times about who he needs to call his his glucose is too high or low. Explained several times that he will need to reach out to his PCP or providers at the [facility] regarding DM management. Patient states his PCP is in [place] and he only sees them 2 times a year and goes to [place] once a year. Encouraged patient to call PCP and get appointment to be seen within a week but patient states he is certain they wont see him before his scheduled appointment on April 28.  Patient states he needs a PCP that is closer to home and he is interested in seeing an Endocrinologist. Informed patient that I would ask TOC if they can provide a list of local providers taking new patients and that he will need to check with insurance to see if he needs referral to an Endocrinologist. Informed patient about [facility] End[facility] and he states he will ask his PCP about getting a referral there. Patient states he would like to have home health come out a few times after discharge to make sure he is doing things correctly and to have someone to talk to about his glucose. Discussed plan to use once a day Lantus and oral DM medication. Patient states he would like to just take the insulin once a day if possible but he wants to make sure glucose improves. Informed patient I would consult TOC to see if he could get home health  come out and to get list of local providers taking new patients. Patient and wife verbalized understanding and has no questions at this time.  Thanks, Orlando Penner, RN, MSN, CDCES Diabetes Coordinator Inpatient Diabetes Program [phone] (Team Pager from 8am to 5pm)

## 2023-07-21 NOTE — TOC Transition Note (Signed)
 Transition of Care [facility]) - Discharge Note   Patient Details  Name: Brian Huffman. MRN: [id number] Date of Birth: 02/10/1946  [facility] CM/SW Contact:  Cherre Blanc, RN Phone Number: 07/21/2023, 6:55 AM   Clinical Narrative:     Patient is medically clear for discharge to home with Home Health PT/OT. Jocelyn Lamer with [facility] accepted the referral for Home Health PT/OT. TOC called to Jon at [employer] and requested a RW to be delivered to the patient's room. No other TOC needs identified.        Patient Goals and CMS Choice            Discharge Placement                       Discharge Plan and Services Additional resources added to the After Visit Summary for                                       Social Drivers of Health (SDOH) Interventions SDOH Screenings   Food Insecurity: No Food Insecurity (07/18/2023)  Housing: Low Risk  (07/18/2023)  Transportation Needs: No Transportation Needs (07/18/2023)  Utilities: Not At Risk (07/18/2023)  Social Connections: Unknown (07/18/2023)  Tobacco Use: Low Risk  (07/18/2023)     Readmission Risk Interventions     No data to display

## 2023-07-22 ENCOUNTER — Emergency Department

## 2023-07-22 ENCOUNTER — Emergency Department
Admission: EM | Admit: 2023-07-22 | Discharge: 2023-07-22 | Disposition: A | Discharge status: Home / Self Care | Attending: Emergency Medicine | Admitting: Emergency Medicine

## 2023-07-22 DIAGNOSIS — Z950 Presence of cardiac pacemaker: Secondary | ICD-10-CM | POA: Insufficient documentation

## 2023-07-22 DIAGNOSIS — X58XXXA Exposure to other specified factors, initial encounter: Secondary | ICD-10-CM | POA: Diagnosis not present

## 2023-07-22 DIAGNOSIS — S9032XA Contusion of left foot, initial encounter: Secondary | ICD-10-CM | POA: Diagnosis not present

## 2023-07-22 DIAGNOSIS — R1030 Lower abdominal pain, unspecified: Secondary | ICD-10-CM | POA: Diagnosis present

## 2023-07-22 DIAGNOSIS — N189 Chronic kidney disease, unspecified: Secondary | ICD-10-CM | POA: Diagnosis not present

## 2023-07-22 DIAGNOSIS — Z794 Long term (current) use of insulin: Secondary | ICD-10-CM | POA: Insufficient documentation

## 2023-07-22 DIAGNOSIS — E1122 Type 2 diabetes mellitus with diabetic chronic kidney disease: Secondary | ICD-10-CM | POA: Diagnosis not present

## 2023-07-22 DIAGNOSIS — E039 Hypothyroidism, unspecified: Secondary | ICD-10-CM | POA: Diagnosis not present

## 2023-07-22 DIAGNOSIS — I129 Hypertensive chronic kidney disease with stage 1 through stage 4 chronic kidney disease, or unspecified chronic kidney disease: Secondary | ICD-10-CM | POA: Insufficient documentation

## 2023-07-22 DIAGNOSIS — S301XXA Contusion of abdominal wall, initial encounter: Secondary | ICD-10-CM | POA: Diagnosis not present

## 2023-07-22 DIAGNOSIS — E1165 Type 2 diabetes mellitus with hyperglycemia: Secondary | ICD-10-CM | POA: Diagnosis not present

## 2023-07-22 DIAGNOSIS — E119 Type 2 diabetes mellitus without complications: Secondary | ICD-10-CM

## 2023-07-22 DIAGNOSIS — T8089XA Other complications following infusion, transfusion and therapeutic injection, initial encounter: Secondary | ICD-10-CM

## 2023-07-22 LAB — CBC WITH DIFFERENTIAL/PLATELET
Abs Immature Granulocytes: 0.04 10*3/uL (ref 0.00–0.07)
Basophils Absolute: 0.1 10*3/uL (ref 0.0–0.1)
Basophils Relative: 1 %
Eosinophils Absolute: 0.1 10*3/uL (ref 0.0–0.5)
Eosinophils Relative: 1 %
HCT: 38.1 % — ABNORMAL LOW (ref 39.0–52.0)
Hemoglobin: 13.3 g/dL (ref 13.0–17.0)
Immature Granulocytes: 1 %
Lymphocytes Relative: 39 %
Lymphs Abs: 1.7 10*3/uL (ref 0.7–4.0)
MCH: 33.3 pg (ref 26.0–34.0)
MCHC: 34.9 g/dL (ref 30.0–36.0)
MCV: 95.3 fL (ref 80.0–100.0)
Monocytes Absolute: 0.6 10*3/uL (ref 0.1–1.0)
Monocytes Relative: 15 %
Neutro Abs: 1.8 10*3/uL (ref 1.7–7.7)
Neutrophils Relative %: 43 %
Platelets: 126 10*3/uL — ABNORMAL LOW (ref 150–400)
RBC: 4 MIL/uL — ABNORMAL LOW (ref 4.22–5.81)
RDW: 12.4 % (ref 11.5–15.5)
WBC: 4.3 10*3/uL (ref 4.0–10.5)
nRBC: 0 % (ref 0.0–0.2)

## 2023-07-22 LAB — COMPREHENSIVE METABOLIC PANEL WITH GFR
ALT: 27 U/L (ref 0–44)
AST: 48 U/L — ABNORMAL HIGH (ref 15–41)
Albumin: 3.5 g/dL (ref 3.5–5.0)
Alkaline Phosphatase: 71 U/L (ref 38–126)
Anion gap: 10 (ref 5–15)
BUN: 11 mg/dL (ref 8–23)
CO2: 24 mmol/L (ref 22–32)
Calcium: 9.3 mg/dL (ref 8.9–10.3)
Chloride: 101 mmol/L (ref 98–111)
Creatinine, Ser: 0.89 mg/dL (ref 0.61–1.24)
GFR, Estimated: >60 mL/min (ref >60)
Glucose, Bld: 215 mg/dL — ABNORMAL HIGH (ref 70–99)
Potassium: 3.8 mmol/L (ref 3.5–5.1)
Sodium: 135 mmol/L (ref 135–145)
Total Bilirubin: 1 mg/dL (ref 0.0–1.2)
Total Protein: 6.5 g/dL (ref 6.5–8.1)

## 2023-07-22 LAB — BRAIN NATRIURETIC PEPTIDE: B Natriuretic Peptide: 61.1 pg/mL (ref 0.0–100.0)

## 2023-07-22 LAB — CBG MONITORING, ED: Glucose-Capillary: 250 mg/dL — ABNORMAL HIGH (ref 70–99)

## 2023-07-22 LAB — ETHANOL: Alcohol, Ethyl (B): <10 mg/dL (ref <10)

## 2023-07-22 NOTE — Discharge Instructions (Signed)
 Your workup was reassuring for ruling out acute pathology but you should return to the ER if you develop worsening symptoms, fevers, abdominal pain or any other concerns.  Otherwise please follow-up with your primary care doctor to discuss your diabetes further.

## 2023-07-22 NOTE — ED Provider Notes (Signed)
 [facility] Provider Note    Event Date/Time   First MD Initiated Contact with Patient 07/22/23 [phone]     (approximate)   History   No chief complaint on file.   HPI  Brian Huffman. is a 78 y.o. male who was admitted previously for DKA who comes in with EMS for anxiety.  I reviewed the discharge summary from 4/3 where patient has history of hyperlipidemia, hypertension, hypothyroid, neuropathy, insomnia, CKD and was admitted for glucose of 546 and started on insulin drip.  Patient states that he was just discharged on Friday.  Since he was discharged he denies any falls.  He states that he ambulates with a walker.  He states that he has some bruises on his abdomen from where he has been injecting his insulin and he wanted to make sure that was normal.  He also reports some tightness in his legs, does report a mild cough.  He denies any shortness of breath, chest pain.  He denies any new falls.  He does report some tingling sensation/leg tightness in his legs that has had for some time now maybe worse today. He denies any SI, HI.  He denies any new back pain, numbness or tingling in his rectum, urinary or rectal incontinence, weakness in his legs.  Patient does report a pacemaker but he denies any new dizziness, syncopal episode since recent discharge.  Physical Exam   Triage Vital Signs: ED Triage Vitals  Encounter Vitals Group     BP 07/22/23 0640 (!) 142/83     Systolic BP Percentile --      Diastolic BP Percentile --      Pulse Rate 07/22/23 0640 75     Resp 07/22/23 0640 19     Temp 07/22/23 0640 97.7 F (36.5 C)     Temp Source 07/22/23 0640 Oral     SpO2 07/22/23 0635 100 %     Weight 07/22/23 0641 235 lb (106.6 kg)     Height 07/22/23 0641 5\' 5"  (1.651 m)     Head Circumference --      Peak Flow --      Pain Score 07/22/23 0640 3     Pain Loc --      Pain Education --      Exclude from Growth Chart --     Most recent vital  signs: Vitals:   07/22/23 0635 07/22/23 0640  BP:  (!) 142/83  Pulse:  75  Resp:  19  Temp:  97.7 F (36.5 C)  SpO2: 100% 100%     General: Awake, no distress.  CV:  Good peripheral perfusion.  Pacemaker noted Resp:  Normal effort.  Abd:  No distention.  Soft and nontender Other:  No significant swelling noted.  He does report some tightness.  Good distal pulses bilaterally he reports some tingling sensation in his feet.  He is got some bruising noted with some tenderness in his feet which he reports is from his fall prior to being admitted.  His abdomen has some scattered bruising on and noted as well Equal strength in both legs.  Able to lift them up off the bed.  ED Results / Procedures / Treatments   Labs (all labs ordered are listed, but only abnormal results are displayed) Labs Reviewed  CBG MONITORING, ED - Abnormal; Notable for the following components:      Result Value   Glucose-Capillary 250 (*)    All other components  within normal limits  CBC WITH DIFFERENTIAL/PLATELET  COMPREHENSIVE METABOLIC PANEL WITH GFR  ETHANOL  URINALYSIS, ROUTINE W REFLEX MICROSCOPIC  URINE DRUG SCREEN, QUALITATIVE ([facility] ONLY)     RADIOLOGY I have reviewed the xray personally and interpreted and no PNA    PROCEDURES:  Critical Care performed: No  Procedures   MEDICATIONS ORDERED IN ED: Medications - No data to display   IMPRESSION / MDM / ASSESSMENT AND PLAN / ED COURSE  I reviewed the triage vital signs and the nursing notes.   Patient's presentation is most consistent with acute presentation with potential threat to life or bodily function.   Patient comes in with multiple questions about his diabetes and concern for the bruising on his abdomen.  I suspect this is related to his insulin injections.  He also has a bruising noted on his feet but he states that this was from a fall prior to being admitted last time.  I reviewed the images where he did have an x-ray of his  left foot and ankle but he never had the right foot done.  I will add on x-rays of this as well as BNP to look for any signs of heart failure although no evidence of obvious swelling.  He has good distal pulses unlikely arterial issue.  I do not see any evidence of cellulitis.  I will get ultrasound just to ensure no evidence of DVT given he does report some tightness in his legs with his recent hospitalization.  He denies any SI or HI.  EtOH was negative CBC shows stable hemoglobin and platelets are low but similar to prior.  CMP shows elevated glucose at 215 but no anion gap to suggest DKA.  IMPRESSION: No evidence of deep venous thrombosis in either lower extremity.   IMPRESSION: No acute cardiopulmonary abnormality.    IMPRESSION: No acute fracture or dislocation identified about the right foot.   Discussed with patient his reassuring workup and need to follow-up with his primary care doctor.  Patient felt comfortable with this plan he denies any other concerns.     FINAL CLINICAL IMPRESSION(S) / ED DIAGNOSES   Final diagnoses:  Type 2 diabetes mellitus without complication, with long-term current use of insulin (HCC)  Bruising at injection site     Rx / DC Orders   ED Discharge Orders     None        Note:  This document was prepared using Dragon voice recognition software and may include unintentional dictation errors.   Concha Se, MD 07/22/23 [phone]

## 2023-07-22 NOTE — ED Triage Notes (Addendum)
 Pt arrived via ems from home. Per EMS pt was brought here for anxiety. Pt states he was "thinking about life and about his recent hospital visit on Wednesday or Tuesday." Pt was also concerned about his bruising on his abdomin from his insulin injections. Bilateral bruising noticed on lower abdomin. Pt is also complaining for bilateral leg tightness

## 2023-08-03 ENCOUNTER — Other Ambulatory Visit (HOSPITAL_COMMUNITY): Payer: Self-pay

## 2023-08-28 ENCOUNTER — Encounter (HOSPITAL_COMMUNITY): Payer: Self-pay

## 2023-08-28 ENCOUNTER — Encounter: Payer: Self-pay | Admitting: Gastroenterology

## 2023-09-01 ENCOUNTER — Encounter: Payer: Self-pay | Admitting: Gastroenterology

## 2023-09-04 ENCOUNTER — Ambulatory Visit: Admitting: Anesthesiology

## 2023-09-04 ENCOUNTER — Other Ambulatory Visit: Payer: Self-pay

## 2023-09-04 ENCOUNTER — Encounter: Payer: Self-pay | Admitting: Gastroenterology

## 2023-09-04 ENCOUNTER — Ambulatory Visit
Admission: RE | Admit: 2023-09-04 | Discharge: 2023-09-04 | Disposition: A | Discharge status: Home / Self Care | Attending: Gastroenterology | Admitting: Gastroenterology

## 2023-09-04 ENCOUNTER — Encounter
Admission: RE | Disposition: A | Discharge status: Home / Self Care | Payer: Self-pay | Source: Home / Self Care | Attending: Gastroenterology

## 2023-09-04 DIAGNOSIS — Z1211 Encounter for screening for malignant neoplasm of colon: Secondary | ICD-10-CM | POA: Insufficient documentation

## 2023-09-04 DIAGNOSIS — Z950 Presence of cardiac pacemaker: Secondary | ICD-10-CM | POA: Diagnosis not present

## 2023-09-04 DIAGNOSIS — K640 First degree hemorrhoids: Secondary | ICD-10-CM | POA: Insufficient documentation

## 2023-09-04 DIAGNOSIS — Z800 Family history of malignant neoplasm of digestive organs: Secondary | ICD-10-CM | POA: Insufficient documentation

## 2023-09-04 DIAGNOSIS — K5730 Diverticulosis of large intestine without perforation or abscess without bleeding: Secondary | ICD-10-CM | POA: Diagnosis not present

## 2023-09-04 HISTORY — PX: POLYPECTOMY: SHX149

## 2023-09-04 HISTORY — DX: Type 2 diabetes mellitus without complications: E11.9

## 2023-09-04 HISTORY — PX: COLONOSCOPY: SHX5424

## 2023-09-04 LAB — GLUCOSE, CAPILLARY: Glucose-Capillary: 77 mg/dL (ref 70–99)

## 2023-09-04 SURGERY — COLONOSCOPY
Anesthesia: General

## 2023-09-04 MED ORDER — PHENYLEPHRINE 80 MCG/ML (10ML) SYRINGE FOR IV PUSH (FOR BLOOD PRESSURE SUPPORT)
PREFILLED_SYRINGE | INTRAVENOUS | Status: AC
  Filled 2023-09-04: qty 10

## 2023-09-04 MED ORDER — SODIUM CHLORIDE 0.9 % IV SOLN: INTRAVENOUS | Status: DC

## 2023-09-04 MED ORDER — LIDOCAINE HCL (CARDIAC) PF 100 MG/5ML IV SOSY
PREFILLED_SYRINGE | INTRAVENOUS | Status: DC | PRN
  Administered 2023-09-04: 80 mg via INTRAVENOUS

## 2023-09-04 MED ORDER — PROPOFOL 1000 MG/100ML IV EMUL
INTRAVENOUS | Status: AC
  Filled 2023-09-04: qty 100

## 2023-09-04 MED ORDER — PROPOFOL 10 MG/ML IV BOLUS
INTRAVENOUS | Status: DC | PRN
Start: 2023-09-04 — End: 2023-09-04
  Administered 2023-09-04: 100 mg via INTRAVENOUS

## 2023-09-04 MED ORDER — PHENYLEPHRINE 80 MCG/ML (10ML) SYRINGE FOR IV PUSH (FOR BLOOD PRESSURE SUPPORT)
PREFILLED_SYRINGE | INTRAVENOUS | Status: DC | PRN
  Administered 2023-09-04 (×2): 80 ug via INTRAVENOUS

## 2023-09-04 MED ORDER — LIDOCAINE HCL (PF) 2 % IJ SOLN
INTRAMUSCULAR | Status: AC
  Filled 2023-09-04: qty 5

## 2023-09-04 MED ORDER — PROPOFOL 500 MG/50ML IV EMUL
INTRAVENOUS | Status: DC | PRN
  Administered 2023-09-04: 150 ug/kg/min via INTRAVENOUS

## 2023-09-04 NOTE — Op Note (Signed)
 [facility] Gastroenterology Patient Name: Brian Huffman Procedure Date: 09/04/2023 12:37 PM MRN: [id number] Account #: [id number] Date of Birth: 1945/07/17 Admit Type: Outpatient Age: 78 Room: [facility] ENDO ROOM 2 Gender: Male Note Status: Finalized Instrument Name: Colonoscope [id number] Procedure:             Colonoscopy Indications:           High risk colon cancer surveillance: Personal history                         of colonic polyps Providers:             Bridgett Camps, DO Referring MD:          Wheeler Hammonds. Felipe Horton (Referring MD) Medicines:             Monitored Anesthesia Care Complications:         No immediate complications. Estimated blood loss:                         Minimal. Procedure:             Pre-Anesthesia Assessment:                        - Prior to the procedure, a History and Physical was                         performed, and patient medications and allergies were                         reviewed. The patient is competent. The risks and                         benefits of the procedure and the sedation options and                         risks were discussed with the patient. All questions                         were answered and informed consent was obtained.                         Patient identification and proposed procedure were                         verified by the physician, the nurse, the anesthetist                         and the technician in the endoscopy suite. Mental                         Status Examination: alert and oriented. Airway                         Examination: normal oropharyngeal airway and neck                         mobility. Respiratory Examination: clear to  auscultation. CV Examination: RRR, no murmurs, no S3                         or S4. Prophylactic Antibiotics: The patient does not                         require prophylactic antibiotics. Prior                          Anticoagulants: The patient has taken no anticoagulant                         or antiplatelet agents. ASA Grade Assessment: III - A                         patient with severe systemic disease. After reviewing                         the risks and benefits, the patient was deemed in                         satisfactory condition to undergo the procedure. The                         anesthesia plan was to use monitored anesthesia care                         (MAC). Immediately prior to administration of                         medications, the patient was re-assessed for adequacy                         to receive sedatives. The heart rate, respiratory                         rate, oxygen saturations, blood pressure, adequacy of                         pulmonary ventilation, and response to care were                         monitored throughout the procedure. The physical                         status of the patient was re-assessed after the                         procedure.                        After obtaining informed consent, the colonoscope was                         passed under direct vision. Throughout the procedure,                         the patient's blood pressure, pulse, and oxygen  saturations were monitored continuously. The                         Colonoscope was introduced through the anus and                         advanced to the the cecum, identified by appendiceal                         orifice and ileocecal valve. The colonoscopy was                         performed without difficulty. The patient tolerated                         the procedure well. The quality of the bowel                         preparation was evaluated using the BBPS [facility] Bowel                         Preparation Scale) with scores of: Right Colon = 2                         (minor amount of residual staining, small fragments of                         stool  and/or opaque liquid, but mucosa seen well),                         Transverse Colon = 3 (entire mucosa seen well with no                         residual staining, small fragments of stool or opaque                         liquid) and Left Colon = 3 (entire mucosa seen well                         with no residual staining, small fragments of stool or                         opaque liquid). The total BBPS score equals 8. Fair in                         right colon. Good in remaining colon. The ileocecal                         valve, appendiceal orifice, and rectum were                         photographed. Findings:      The perianal and digital rectal examinations were normal. Pertinent       negatives include normal sphincter tone.      Three sessile polyps were found in the ascending colon. The polyps were       1 to 3 mm in size.  These polyps were removed with a jumbo cold forceps.       Resection and retrieval were complete. Estimated blood loss was minimal.      Non-bleeding internal hemorrhoids were found during retroflexion. The       hemorrhoids were Grade I (internal hemorrhoids that do not prolapse).       Estimated blood loss: none.      Anal papilla(e) were hypertrophied. Estimated blood loss: none.      The exam was otherwise without abnormality on direct and retroflexion       views.      adherent stool in right colon occasionally interfered with       visualization, but no large lesion. Lavage was used to help clean and       improve visualization. Impression:            - Three 1 to 3 mm polyps in the ascending colon,                         removed with a jumbo cold forceps. Resected and                         retrieved.                        - Non-bleeding internal hemorrhoids.                        - Anal papilla(e) were hypertrophied.                        - The examination was otherwise normal on direct and                         retroflexion  views. Recommendation:        - Patient has a contact number available for                         emergencies. The signs and symptoms of potential                         delayed complications were discussed with the patient.                         Return to normal activities tomorrow. Written                         discharge instructions were provided to the patient.                        - Discharge patient to home.                        - Resume previous diet.                        - Continue present medications.                        - Await pathology results.                        -  Repeat colonoscopy for surveillance based on                         pathology results.                        - Return to referring physician as previously                         scheduled.                        - The findings and recommendations were discussed with                         the patient. Procedure Code(s):     --- Professional ---                        [phone], Colonoscopy, flexible; with biopsy, single or                         multiple Diagnosis Code(s):     --- Professional ---                        Z86.010, Personal history of colonic polyps                        D12.2, Benign neoplasm of ascending colon                        K62.89, Other specified diseases of anus and rectum                        K64.0, First degree hemorrhoids CPT copyright 2022 American Medical Association. All rights reserved. The codes documented in this report are preliminary and upon coder review may  be revised to meet current compliance requirements. Attending Participation:      I personally performed the entire procedure. Polo Brisk, DO [name] DO, DO 09/04/2023 1:13:06 PM This report has been signed electronically. Number of Addenda: 0 Note Initiated On: 09/04/2023 12:37 PM Scope Withdrawal Time: 0 hours 11 minutes 13 seconds  Total Procedure Duration: 0 hours 14 minutes  33 seconds  Estimated Blood Loss:  Estimated blood loss was minimal.      [facility]

## 2023-09-04 NOTE — Interval H&P Note (Signed)
 History and Physical Interval Note: Preprocedure H&P from 09/04/23  was reviewed and there was no interval change after seeing and examining the patient.  Written consent was obtained from the patient after discussion of risks, benefits, and alternatives. Patient has consented to proceed with Colonoscopy with possible intervention \  09/04/2023 12:08 PM  Brian Huffman.  has presented today for surgery, with the diagnosis of Adenomatous polyp of colon, unspecified part of colon (D12.6).  The various methods of treatment have been discussed with the patient and family. After consideration of risks, benefits and other options for treatment, the patient has consented to  Procedure(s): COLONOSCOPY (N/A) as a surgical intervention.  The patient's history has been reviewed, patient examined, no change in status, stable for surgery.  I have reviewed the patient's chart and labs.  Questions were answered to the patient's satisfaction.     [name]

## 2023-09-04 NOTE — H&P (Signed)
 Pre-Procedure H&P   Patient ID: Brian Cudworth. is a 78 y.o. male.  Gastroenterology Provider: [name], DO  PCP: Rosebud Confer, MD  Date: 09/04/2023  HPI Mr. Brian Huffman. is a 78 y.o. male who presents today for Colonoscopy for Personal history of colon polyps, family history of colon cancer .  Patient last underwent colonoscopy in January 2023 with a BBPS of 8.  Left-sided diverticulosis, normal TI and internal hemorrhoids.  11 tubular adenomas were removed.  Reattempts at colonoscopy x 2 since then given number of adenomatous, however, prep has not been adequate for the last 2 times.  He does deal with constipation at home.  No melena or hematochezia  Prior colonoscopies 2019 1 adenomatous polyp, 2015 multiple tubular adenomas  Hemoglobin 13.3 MCV 95 platelets 126,000 creatinine 0.9 Status post PPM Father history of colon cancer   Past Medical History:  Diagnosis Date   Allergic genetic state    Diabetes mellitus without complication (HCC)    Family hx of colon cancer    Gout    HLD (hyperlipidemia)    Hypertension    Hypothyroidism    Obesity    Trigeminal neuralgia    Trigeminal neuralgia    Vertigo     Past Surgical History:  Procedure Laterality Date   COLONOSCOPY N/A 04/26/2021   Procedure: COLONOSCOPY;  Surgeon: [name], DO;  Location[facility] ENDOSCOPY;  Service: Gastroenterology;  Laterality: N/A;   COLONOSCOPY W/ POLYPECTOMY  2002, 2003, 2007, 2010, 2015.   adenomatous polyps   COLONOSCOPY WITH PROPOFOL  N/A 01/10/2018   Procedure: COLONOSCOPY WITH PROPOFOL ;  Surgeon: Cassie Click, MD;  Location: [facility] ENDOSCOPY;  Service: Endoscopy;  Laterality: N/A;   INSERT / REPLACE / REMOVE PACEMAKER      Family History Father- colon cancer No other h/o GI disease or malignancy  Review of Systems  Constitutional:  Negative for activity change, appetite change, chills, diaphoresis, fatigue, fever and  unexpected weight change.  HENT:  Negative for trouble swallowing and voice change.   Respiratory:  Negative for shortness of breath and wheezing.   Cardiovascular:  Negative for chest pain, palpitations and leg swelling.  Gastrointestinal:  Positive for constipation. Negative for abdominal distention, abdominal pain, anal bleeding, blood in stool, diarrhea, nausea and vomiting.  Musculoskeletal:  Negative for arthralgias and myalgias.  Skin:  Negative for color change and pallor.  Neurological:  Negative for dizziness, syncope and weakness.  Psychiatric/Behavioral:  Negative for confusion. The patient is not nervous/anxious.   All other systems reviewed and are negative.    Medications No current facility-administered medications on file prior to encounter.   Current Outpatient Medications on File Prior to Encounter  Medication Sig Dispense Refill   allopurinol  (ZYLOPRIM ) 100 MG tablet Take 1 tablet by mouth daily.     B Complex Vitamins (VITAMIN B COMPLEX PO) Take 1 tablet by mouth daily.     carbamazepine  (TEGRETOL  XR) 100 MG 12 hr tablet Take 100 mg by mouth 4 (four) times daily.     Cholecalciferol (VITAMIN D3) 250 MCG (10000 UT) TABS Take 1 tablet by mouth daily.     Coenzyme Q10 100 MG TABS Take 1 tablet by mouth daily.     fluticasone  (FLONASE ) 50 MCG/ACT nasal spray Place 2 sprays into both nostrils daily as needed for rhinitis or allergies.     gabapentin  (NEURONTIN ) 100 MG capsule Take 100 mg by mouth 3 (three) times daily.  levothyroxine  (SYNTHROID ) 200 MCG tablet Take 200 mcg by mouth every evening.     milk thistle 175 MG tablet Take 175 mg by mouth daily.     Multiple Vitamin (MULTIVITAMIN) tablet Take 1 tablet by mouth daily.     rosuvastatin  (CRESTOR ) 20 MG tablet Take 10 mg by mouth daily.     tamsulosin  (FLOMAX ) 0.4 MG CAPS capsule Take 0.4 mg by mouth daily.     tiotropium (SPIRIVA ) 18 MCG inhalation capsule Place 1 capsule (18 mcg total) into inhaler and inhale  daily. (Patient taking differently: Place 18 mcg into inhaler and inhale 2 (two) times daily.) 30 capsule 1   valsartan (DIOVAN) 320 MG tablet Take 320 mg by mouth daily.     albuterol  (PROVENTIL  HFA;VENTOLIN  HFA) 108 (90 Base) MCG/ACT inhaler Inhale 2 puffs into the lungs every 6 (six) hours as needed for wheezing or shortness of breath. 1 Inhaler 2   cetirizine (ZYRTEC) 10 MG tablet Take 10 mg by mouth daily as needed for allergies or rhinitis.     Spacer/Aero-Holding Chambers (AEROCHAMBER MV) inhaler Use as instructed 1 each 0    Pertinent medications related to GI and procedure were reviewed by me with the patient prior to the procedure   Current Facility-Administered Medications:    0.9 %  sodium chloride  infusion, , Intravenous, Continuous, [name], DO, Last Rate: 20 mL/hr at 09/04/23 1154, New Bag at 09/04/23 1154  sodium chloride  20 mL/hr at 09/04/23 1154       Allergies  Allergen Reactions   [place] Grass Extract Other (See Comments)    Difficulty breathing   Azithromycin  Hives   Other Other (See Comments)    DUST, sneezing, watery eyes   Statins Other (See Comments)    Atorvastatin , simvastatin and pravastatin     Penicillins Rash   Shellfish-Derived Products Rash   Allergies were reviewed by me prior to the procedure  Objective   Body mass index is 36.44 kg/m. Vitals:   09/04/23 1146  BP: (!) 131/53  Pulse: 78  Resp: 18  Temp: (!) 96.5 F (35.8 C)  TempSrc: Temporal  SpO2: 99%  Weight: 99.3 kg  Height: 5\' 5"  (1.651 m)     Physical Exam Vitals and nursing note reviewed.  Constitutional:      General: He is not in acute distress.    Appearance: Normal appearance. He is obese. He is not ill-appearing, toxic-appearing or diaphoretic.  HENT:     Head: Normocephalic and atraumatic.     Nose: Nose normal.     Mouth/Throat:     Mouth: Mucous membranes are moist.     Pharynx: Oropharynx is clear.  Eyes:     General: No scleral icterus.     Extraocular Movements: Extraocular movements intact.  Cardiovascular:     Rate and Rhythm: Normal rate and regular rhythm.     Heart sounds: Normal heart sounds. No murmur heard.    No friction rub. No gallop.  Pulmonary:     Effort: Pulmonary effort is normal. No respiratory distress.     Breath sounds: Normal breath sounds. No wheezing, rhonchi or rales.  Abdominal:     General: Bowel sounds are normal. There is no distension.     Palpations: Abdomen is soft.     Tenderness: There is no abdominal tenderness. There is no guarding or rebound.  Musculoskeletal:     Cervical back: Neck supple.     Right lower leg: No edema.  Left lower leg: No edema.  Skin:    General: Skin is warm and dry.     Coloration: Skin is not jaundiced or pale.  Neurological:     General: No focal deficit present.     Mental Status: He is alert and oriented to person, place, and time. Mental status is at baseline.  Psychiatric:        Mood and Affect: Mood normal.        Behavior: Behavior normal.        Thought Content: Thought content normal.        Judgment: Judgment normal.      Assessment:  Mr. Najir Roop. is a 78 y.o. male  who presents today for Colonoscopy for Personal history of colon polyps, family history of colon cancer .  Plan:  Colonoscopy with possible intervention today  Colonoscopy with possible biopsy, control of bleeding, polypectomy, and interventions as necessary has been discussed with the patient/patient representative. Informed consent was obtained from the patient/patient representative after explaining the indication, nature, and risks of the procedure including but not limited to death, bleeding, perforation, missed neoplasm/lesions, cardiorespiratory compromise, and reaction to medications. Opportunity for questions was given and appropriate answers were provided. Patient/patient representative has verbalized understanding is amenable to undergoing the  procedure.   [name], DO  [facility] Gastroenterology  Portions of the record may have been created with voice recognition software. Occasional wrong-word or 'sound-a-like' substitutions may have occurred due to the inherent limitations of voice recognition software.  Read the chart carefully and recognize, using context, where substitutions may have occurred.

## 2023-09-04 NOTE — Transfer of Care (Signed)
 Immediate Anesthesia Transfer of Care Note  Patient: Brian Huffman.  Procedure(s) Performed: COLONOSCOPY POLYPECTOMY, INTESTINE  Patient Location: PACU  Anesthesia Type:General  Level of Consciousness: awake and sedated  Airway & Oxygen Therapy: Patient Spontanous Breathing and Patient connected to face mask oxygen  Post-op Assessment: Report given to RN and Post -op Vital signs reviewed and stable  Post vital signs: Reviewed and stable  Last Vitals:  Vitals Value Taken Time  BP    Temp    Pulse    Resp    SpO2      Last Pain:  Vitals:   09/04/23 1146  TempSrc: Temporal  PainSc: 0-No pain         Complications: There were no known notable events for this encounter.

## 2023-09-05 ENCOUNTER — Encounter: Payer: Self-pay | Admitting: Gastroenterology

## 2023-09-05 LAB — SURGICAL PATHOLOGY

## 2023-09-06 NOTE — Anesthesia Preprocedure Evaluation (Signed)
 Anesthesia Evaluation  Patient identified by MRN, date of birth, ID band Patient awake    Reviewed: Allergy & Precautions, H&P , NPO status , Patient's Chart, lab work & pertinent test results, reviewed documented beta blocker date and time   Airway Mallampati: II   Neck ROM: full    Dental  (+) Poor Dentition   Pulmonary pneumonia   Pulmonary exam normal        Cardiovascular Exercise Tolerance: Poor hypertension, On Medications negative cardio ROS Normal cardiovascular exam Rhythm:regular Rate:Normal     Neuro/Psych  Neuromuscular disease  negative psych ROS   GI/Hepatic negative GI ROS, Neg liver ROS,,,  Endo/Other  diabetesHypothyroidism    Renal/GU negative Renal ROS  negative genitourinary   Musculoskeletal   Abdominal   Peds  Hematology negative hematology ROS (+)   Anesthesia Other Findings Past Medical History: No date: Allergic genetic state No date: Diabetes mellitus without complication (HCC) No date: Family hx of colon cancer No date: Gout No date: HLD (hyperlipidemia) No date: Hypertension No date: Hypothyroidism No date: Obesity No date: Trigeminal neuralgia No date: Trigeminal neuralgia No date: Vertigo Past Surgical History: 04/26/2021: COLONOSCOPY; N/A     Comment:  Procedure: COLONOSCOPY;  Surgeon: [name],              DO;  Location: [facility] ENDOSCOPY;  Service:               Gastroenterology;  Laterality: N/A; 09/04/2023: COLONOSCOPY; N/A     Comment:  Procedure: COLONOSCOPY;  Surgeon: [name],              DO;  Location: [facility] ENDOSCOPY;  Service:               Gastroenterology;  Laterality: N/A; 2002, 2003, 2007, 2010, 2015.: COLONOSCOPY W/ POLYPECTOMY     Comment:  adenomatous polyps 01/10/2018: COLONOSCOPY WITH PROPOFOL ; N/A     Comment:  Procedure: COLONOSCOPY WITH PROPOFOL ;  Surgeon: Cassie Click, MD;  Location: [facility] ENDOSCOPY;   Service:               Endoscopy;  Laterality: N/A; No date: INSERT / REPLACE / REMOVE PACEMAKER 09/04/2023: POLYPECTOMY     Comment:  Procedure: POLYPECTOMY, INTESTINE;  Surgeon: [name], DO;  Location: [facility] ENDOSCOPY;  Service:               Gastroenterology;; BMI    Body Mass Index: 36.44 kg/m     Reproductive/Obstetrics negative OB ROS                             Anesthesia Physical Anesthesia Plan  ASA: 3  Anesthesia Plan: General   Post-op Pain Management:    Induction:   PONV Risk Score and Plan:   Airway Management Planned:   Additional Equipment:   Intra-op Plan:   Post-operative Plan:   Informed Consent: I have reviewed the patients History and Physical, chart, labs and discussed the procedure including the risks, benefits and alternatives for the proposed anesthesia with the patient or authorized representative who has indicated his/her understanding and acceptance.     Dental Advisory Given  Plan Discussed with: CRNA  Anesthesia Plan Comments:        Anesthesia Quick Evaluation

## 2023-09-06 NOTE — Anesthesia Postprocedure Evaluation (Signed)
 Anesthesia Post Note  Patient: Brian Huffman.  Procedure(s) Performed: COLONOSCOPY POLYPECTOMY, INTESTINE  Patient location during evaluation: PACU Anesthesia Type: General Level of consciousness: awake and alert Pain management: pain level controlled Vital Signs Assessment: post-procedure vital signs reviewed and stable Respiratory status: spontaneous breathing, nonlabored ventilation, respiratory function stable and patient connected to nasal cannula oxygen Cardiovascular status: blood pressure returned to baseline and stable Postop Assessment: no apparent nausea or vomiting Anesthetic complications: no   There were no known notable events for this encounter.   Last Vitals:  Vitals:   09/04/23 1327 09/04/23 1333  BP: 110/61 136/79  Pulse: 77 74  Resp: 18 15  Temp:    SpO2: 96% 97%    Last Pain:  Vitals:   09/04/23 1333  TempSrc:   PainSc: 0-No pain                 Zula Hitch

## 2023-11-24 ENCOUNTER — Emergency Department

## 2023-11-24 ENCOUNTER — Emergency Department
Admission: EM | Admit: 2023-11-24 | Discharge: 2023-11-24 | Disposition: A | Discharge status: Home / Self Care | Attending: Emergency Medicine | Admitting: Emergency Medicine

## 2023-11-24 ENCOUNTER — Other Ambulatory Visit: Payer: Self-pay

## 2023-11-24 DIAGNOSIS — J45909 Unspecified asthma, uncomplicated: Secondary | ICD-10-CM | POA: Insufficient documentation

## 2023-11-24 DIAGNOSIS — E039 Hypothyroidism, unspecified: Secondary | ICD-10-CM | POA: Insufficient documentation

## 2023-11-24 DIAGNOSIS — R1031 Right lower quadrant pain: Secondary | ICD-10-CM | POA: Insufficient documentation

## 2023-11-24 DIAGNOSIS — I10 Essential (primary) hypertension: Secondary | ICD-10-CM | POA: Insufficient documentation

## 2023-11-24 DIAGNOSIS — E119 Type 2 diabetes mellitus without complications: Secondary | ICD-10-CM | POA: Insufficient documentation

## 2023-11-24 LAB — URINALYSIS, ROUTINE W REFLEX MICROSCOPIC
Bacteria, UA: NONE SEEN
Bilirubin Urine: NEGATIVE
Glucose, UA: NEGATIVE mg/dL
Hgb urine dipstick: NEGATIVE
Ketones, ur: 5 mg/dL — AB
Nitrite: NEGATIVE
Protein, ur: NEGATIVE mg/dL
Specific Gravity, Urine: 1.016 (ref 1.005–1.030)
pH: 5 (ref 5.0–8.0)

## 2023-11-24 LAB — BASIC METABOLIC PANEL WITH GFR
Anion gap: 11 (ref 5–15)
BUN: 33 mg/dL — ABNORMAL HIGH (ref 8–23)
CO2: 24 mmol/L (ref 22–32)
Calcium: 9.8 mg/dL (ref 8.9–10.3)
Chloride: 103 mmol/L (ref 98–111)
Creatinine, Ser: 0.88 mg/dL (ref 0.61–1.24)
GFR, Estimated: >60 mL/min (ref >60)
Glucose, Bld: 108 mg/dL — ABNORMAL HIGH (ref 70–99)
Potassium: 4.1 mmol/L (ref 3.5–5.1)
Sodium: 138 mmol/L (ref 135–145)

## 2023-11-24 LAB — CBC
HCT: 47.9 % (ref 39.0–52.0)
Hemoglobin: 16.1 g/dL (ref 13.0–17.0)
MCH: 31.9 pg (ref 26.0–34.0)
MCHC: 33.6 g/dL (ref 30.0–36.0)
MCV: 95 fL (ref 80.0–100.0)
Platelets: 143 K/uL — ABNORMAL LOW (ref 150–400)
RBC: 5.04 MIL/uL (ref 4.22–5.81)
RDW: 13.1 % (ref 11.5–15.5)
WBC: 9.8 K/uL (ref 4.0–10.5)
nRBC: 0 % (ref 0.0–0.2)

## 2023-11-24 LAB — LIPASE, BLOOD: Lipase: 32 U/L (ref 11–51)

## 2023-11-24 LAB — HEPATIC FUNCTION PANEL
ALT: 24 U/L (ref 0–44)
AST: 32 U/L (ref 15–41)
Albumin: 4.4 g/dL (ref 3.5–5.0)
Alkaline Phosphatase: 100 U/L (ref 38–126)
Bilirubin, Direct: <0.1 mg/dL (ref 0.0–0.2)
Total Bilirubin: 0.7 mg/dL (ref 0.0–1.2)
Total Protein: 7.5 g/dL (ref 6.5–8.1)

## 2023-11-24 MED ORDER — IOHEXOL 300 MG/ML  SOLN
100.0000 mL | Freq: Once | INTRAMUSCULAR | Status: AC | PRN
  Administered 2023-11-24: 100 mL via INTRAVENOUS

## 2023-11-24 NOTE — ED Triage Notes (Signed)
 Pt comes with right sided flank pain since yesterday. Pt states pain comes and goes. Pt denies any urinary symptoms.

## 2023-11-24 NOTE — ED Notes (Signed)
 See triage note  Presents with right flank pain  States pain has been intermittent  and it started yesterday Afebrile on arrival

## 2023-11-24 NOTE — ED Provider Notes (Signed)
 [facility] Provider Note    Event Date/Time   First MD Initiated Contact with Patient 11/24/23 1233     (approximate)   History   Flank Pain   HPI  Brian Huffman. is a 78 y.o. male   presents to the ED with complaint of right sided groin pain that started yesterday.  Patient states that it is sharp and comes and goes and is not related to movement.  He denies any urinary symptoms or previous UTIs or urolithiasis.  Patient has a history of hypertension, diabetes, BPH, hypothyroidism, reactive airway disease, pneumonia, bronchitis, DKA.      Physical Exam   Triage Vital Signs: ED Triage Vitals  Encounter Vitals Group     BP 11/24/23 1206 (!) 157/67     Girls Systolic BP Percentile --      Girls Diastolic BP Percentile --      Boys Systolic BP Percentile --      Boys Diastolic BP Percentile --      Pulse Rate 11/24/23 1206 88     Resp 11/24/23 1206 18     Temp 11/24/23 1206 98.7 F (37.1 C)     Temp src --      SpO2 11/24/23 1206 98 %     Weight 11/24/23 1204 210 lb (95.3 kg)     Height 11/24/23 1204 5' 5 (1.651 m)     Head Circumference --      Peak Flow --      Pain Score 11/24/23 1204 5     Pain Loc --      Pain Education --      Exclude from Growth Chart --     Most recent vital signs: Vitals:   11/24/23 1206  BP: (!) 157/67  Pulse: 88  Resp: 18  Temp: 98.7 F (37.1 C)  SpO2: 98%     General: Awake, no distress.  Alert, talkative, patient is able to ambulate without any assistance.  Patient is able to stand and ambulate without any assistance. CV:  Good peripheral perfusion.  Resp:  Normal effort.  Abd:  No distention.  Right lower quadrant tenderness.  Other:     ED Results / Procedures / Treatments   Labs (all labs ordered are listed, but only abnormal results are displayed) Labs Reviewed  URINALYSIS, ROUTINE W REFLEX MICROSCOPIC - Abnormal; Notable for the following components:      Result Value   Color,  Urine YELLOW (*)    APPearance CLEAR (*)    Ketones, ur 5 (*)    Leukocytes,Ua TRACE (*)    All other components within normal limits  BASIC METABOLIC PANEL WITH GFR - Abnormal; Notable for the following components:   Glucose, Bld 108 (*)    BUN 33 (*)    All other components within normal limits  CBC - Abnormal; Notable for the following components:   Platelets 143 (*)    All other components within normal limits  HEPATIC FUNCTION PANEL  LIPASE, BLOOD     RADIOLOGY CT abdomen pelvis with contrast pending.    PROCEDURES:  Critical Care performed:   Procedures   MEDICATIONS ORDERED IN ED: Medications - No data to display   IMPRESSION / MDM / ASSESSMENT AND PLAN / ED COURSE  I reviewed the triage vital signs and the nursing notes.   Differential diagnosis includes, but is not limited to, abdominal pain, right lower quadrant pain, urolithiasis, urinary tract infection, diverticulitis, colitis.  -----------------------------------------  3:19 PM on 11/24/2023 -----------------------------------------  78 year old male presents to the ED with complaint of right lower quadrant pain for 2 days that has been intermittent.  No fever, chills, nausea or vomiting has been associated with this pain.  Patient denies any urinary symptoms or history of urolithiasis in the past.  Lab work was reassuring, urinalysis showed trace of leukocytes but no RBCs.  I spoke with patient and he is aware that a CT of his abdomen with contrast has been ordered.  I spoke with Mayah Margrette PA-C about this patient who will be taking over the remainder of his care.  At this time his CT has been ordered.       Patient's presentation is most consistent with acute illness / injury with system symptoms.  FINAL CLINICAL IMPRESSION(S) / ED DIAGNOSES   Final diagnoses:  RLQ abdominal pain     Rx / DC Orders   ED Discharge Orders     None        Note:  This document was prepared using  Dragon voice recognition software and may include unintentional dictation errors.   Saunders Shona CROME, PA-C 11/24/23 1520    Claudene Rover, MD 11/24/23 [phone]

## 2023-11-24 NOTE — ED Provider Notes (Signed)
-----------------------------------------   3:16 PM on 11/24/2023 -----------------------------------------  Blood pressure (!) 157/67, pulse 88, temperature 98.7 F (37.1 C), resp. rate 18, height 5' 5 (1.651 m), weight 95.3 kg, SpO2 98%.  Assuming care from [place], [place].  In short, Brian Huffman. is a 78 y.o. male with a chief complaint of Flank Pain .  Refer to the original H&P for additional details.  The current plan of care is to awaiting CT Abdomen and Pelvis and make appropriate disposition.   CT abdomen and pelvis results updated to patient.  Patient cannot take ibuprofen or Tylenol  so I advised lidocaine  patch over the area and close follow-up with primary care provider.  He verbalized understanding.  Stable for discharge home and outpatient management.      Margrette, Darrielle Pflieger A, PA-C 11/24/23 1831    Claudene Rover, MD 11/24/23 [phone]

## 2023-11-24 NOTE — Discharge Instructions (Addendum)
 Your evaluated in the ED for right lower abdominal pain.  Your lab work is reassuring.  Urinalysis is reassuring.  And your CT abdomen and pelvis is normal.  Please follow-up with your primary care provider for further evaluation.  If symptoms worsen return to ED.

## 2023-11-24 NOTE — ED Notes (Signed)
 Has been up to the bathroom couple of times w/o success
